# Patient Record
Sex: Female | Born: 1980 | Race: White | Hispanic: No | Marital: Single | Smoking: Never smoker
Health system: Southern US, Community
[De-identification: ages and names within clinical notes are randomized; demographics above are authoritative.]

## PROBLEM LIST (undated history)

## (undated) ENCOUNTER — Encounter

## (undated) ENCOUNTER — Encounter: Attending: Family Medicine | Primary: Family Medicine

## (undated) ENCOUNTER — Telehealth

## (undated) ENCOUNTER — Encounter: Attending: Family | Primary: Family

## (undated) ENCOUNTER — Telehealth: Attending: Family | Primary: Family

## (undated) ENCOUNTER — Ambulatory Visit: Payer: PRIVATE HEALTH INSURANCE

## (undated) ENCOUNTER — Ambulatory Visit: Payer: PRIVATE HEALTH INSURANCE | Attending: Registered" | Primary: Registered"

## (undated) ENCOUNTER — Encounter: Attending: Registered" | Primary: Registered"

## (undated) ENCOUNTER — Ambulatory Visit: Payer: PRIVATE HEALTH INSURANCE | Attending: Family Medicine | Primary: Family Medicine

## (undated) ENCOUNTER — Ambulatory Visit: Payer: PRIVATE HEALTH INSURANCE | Attending: "Endocrinology | Primary: "Endocrinology

## (undated) ENCOUNTER — Ambulatory Visit

## (undated) ENCOUNTER — Ambulatory Visit: Attending: Registered" | Primary: Registered"

## (undated) DIAGNOSIS — J45909 Unspecified asthma, uncomplicated: Secondary | ICD-10-CM

## (undated) DIAGNOSIS — K760 Fatty (change of) liver, not elsewhere classified: Secondary | ICD-10-CM

## (undated) DIAGNOSIS — E119 Type 2 diabetes mellitus without complications: Secondary | ICD-10-CM

## (undated) DIAGNOSIS — F419 Anxiety disorder, unspecified: Secondary | ICD-10-CM

---

## 2004-04-26 ENCOUNTER — Emergency Department: Payer: Self-pay | Admitting: Emergency Medicine

## 2005-09-01 IMAGING — CR DG CHEST 2V
1 series · 2 of 2 positions shown · non-contrast
Comparison: none

REASON FOR EXAM: Difficulty breathing. [HOSPITAL]
COMMENTS:

PROCEDURE:     DXR - DXR CHEST PA (OR AP) AND LATERAL  - April 27, 2004 [DATE]
RESULT:     PA and lateral views of the chest show the lungs to be clear and
well expanded without evidence of infiltrates, effusions or mass.

[Series 1: view not recorded · 0.17mm/px · 2 of 2 slices shown]
[im 1/2]
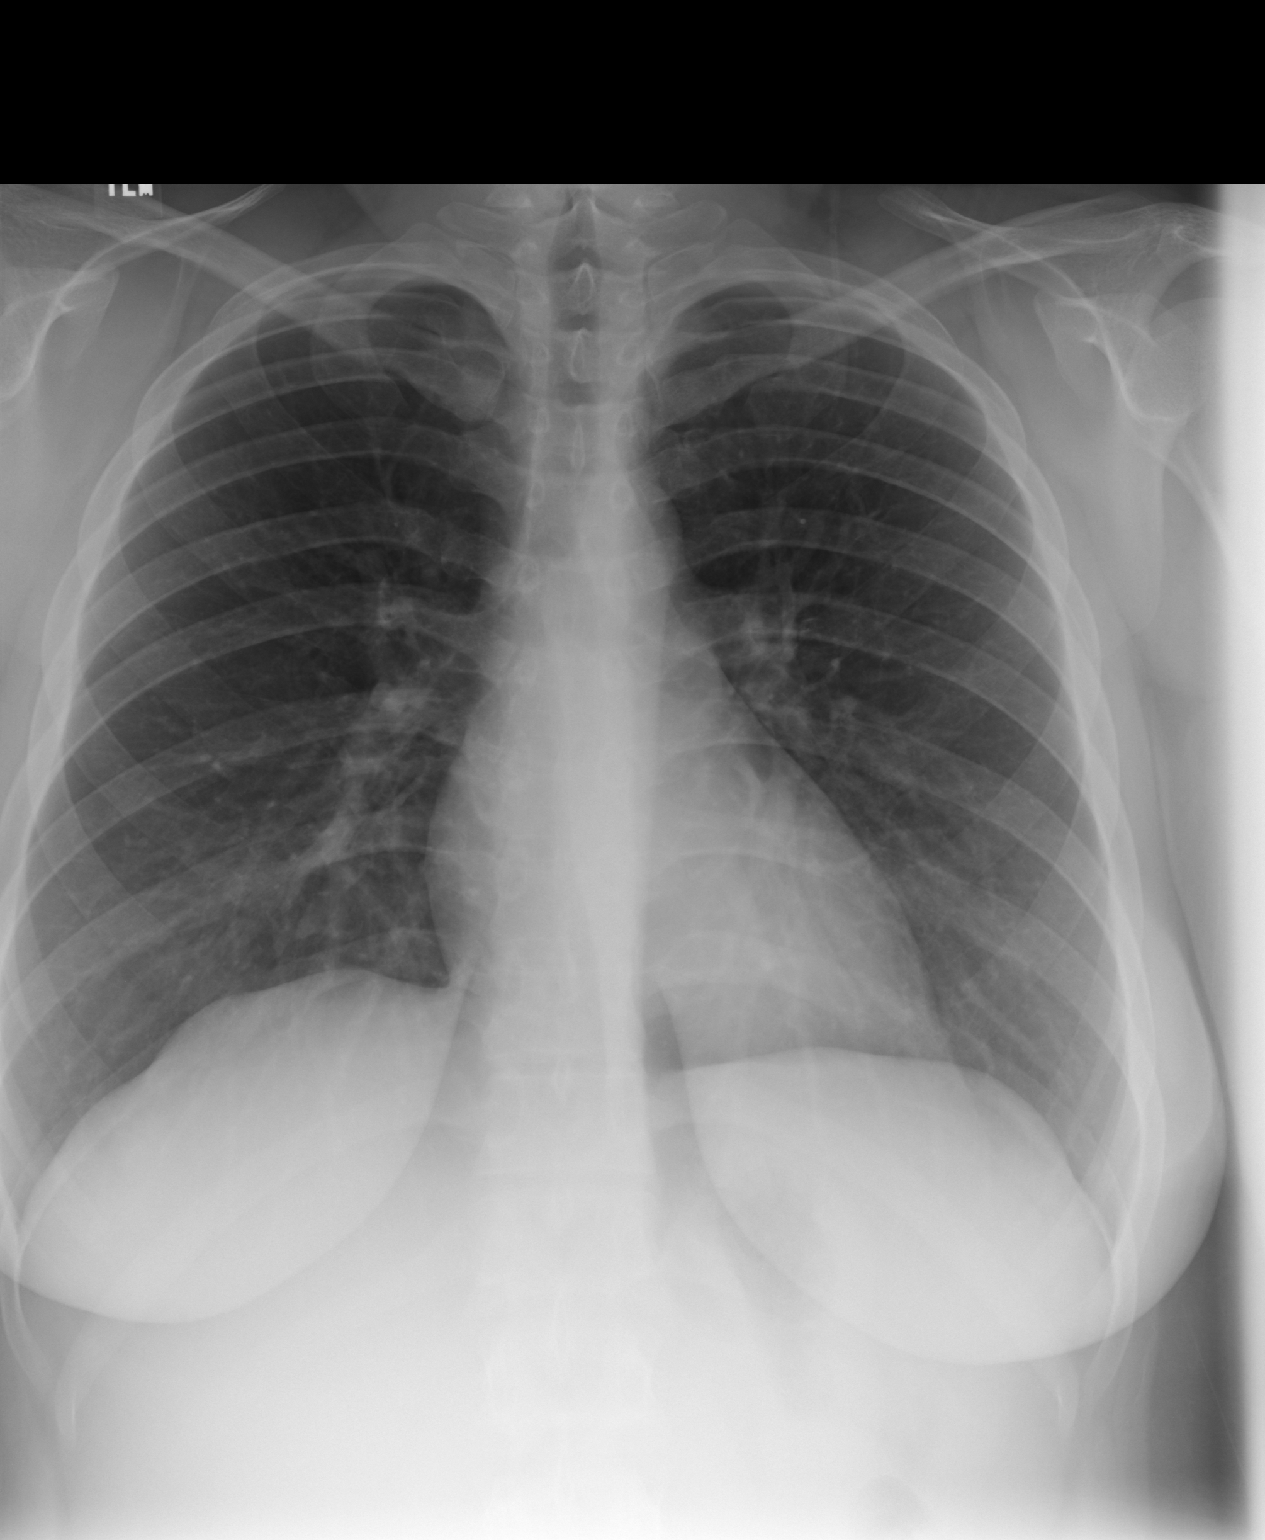
[im 2/2]
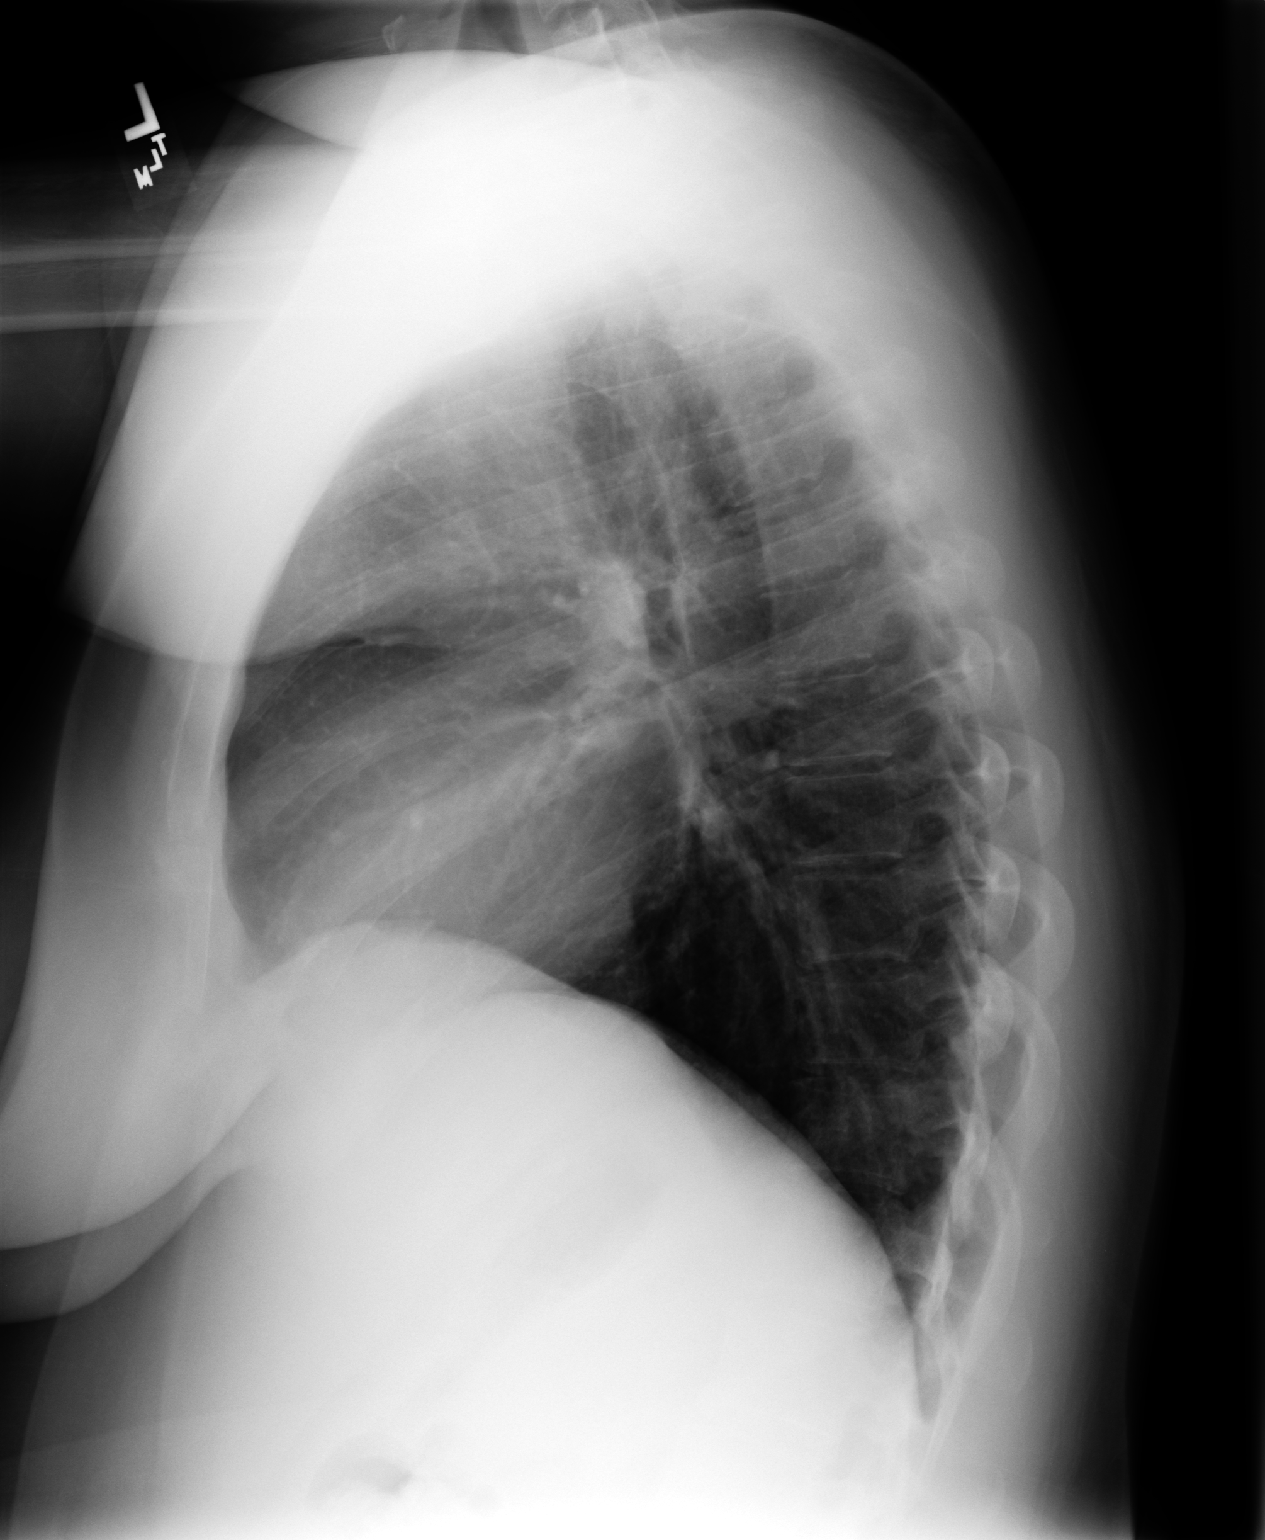

[2 of 2 positions shown; findings below may reference images not displayed]

IMPRESSION: No acute pulmonary disease.

## 2010-02-12 ENCOUNTER — Ambulatory Visit: Payer: Self-pay | Admitting: Internal Medicine

## 2014-07-18 ENCOUNTER — Ambulatory Visit
Admission: EM | Admit: 2014-07-18 | Discharge: 2014-07-18 | Disposition: A | Discharge status: Home / Self Care | Payer: No Typology Code available for payment source | Attending: Internal Medicine | Admitting: Internal Medicine

## 2014-07-18 DIAGNOSIS — S90511A Abrasion, right ankle, initial encounter: Secondary | ICD-10-CM

## 2014-07-18 DIAGNOSIS — L089 Local infection of the skin and subcutaneous tissue, unspecified: Secondary | ICD-10-CM

## 2014-07-18 HISTORY — DX: Unspecified asthma, uncomplicated: J45.909

## 2014-07-18 HISTORY — DX: Anxiety disorder, unspecified: F41.9

## 2014-07-18 MED ORDER — SULFAMETHOXAZOLE-TRIMETHOPRIM 800-160 MG PO TABS: 1.0000 | ORAL_TABLET | Freq: Two times a day (BID) | ORAL | Status: AC

## 2014-07-18 MED ORDER — CEPHALEXIN 500 MG PO CAPS: 500.0000 mg | ORAL_CAPSULE | Freq: Two times a day (BID) | ORAL | Status: DC

## 2014-07-18 NOTE — ED Provider Notes (Signed)
CSN: [id number]     Arrival date & time 07/18/14  1431 History   First MD Initiated Contact with Patient 07/18/14 1517     Chief Complaint  Patient presents with  . Wound Infection   HPI   Patient is a 34 year old lady who just returned from a fun tropical vacation. About a week ago, while on vacation, scraped the lateral aspect of her right ankle. This was not particularly painful or red or swollen, until yesterday, when the patient noticed that it was a little sore. Today, there is a zone of bright erythema around the scrape, and the ankle and foot are swollen. No fever, no malaise.  Past Medical History  Diagnosis Date  . Asthma   . Anxiety    No past surgical history on file. No family history on file. History  Substance Use Topics  . Smoking status: Never Smoker   . Smokeless tobacco: Not on file  . Alcohol Use: Yes   OB History    No data available     Review of Systems  All other systems reviewed and are negative.   Allergies  Klonopin; Lexapro; and Tramadol  Home Medications   Prior to Admission medications   Medication Sig Start Date End Date Taking? Authorizing Provider  albuterol (PROVENTIL HFA) 108 (90 BASE) MCG/ACT inhaler Inhale into the lungs every 6 (six) hours as needed for wheezing or shortness of breath.   Yes Historical Provider, MD  Norgestrel-Ethinyl Estradiol (LOW-OGESTREL PO) Take by mouth.   Yes Historical Provider, MD                 BP 120/67 mmHg  Pulse 87  Temp(Src) 99.1 F (37.3 C) (Tympanic)  Resp 16  Ht 5\' 7"  (1.702 m)  Wt 220 lb (99.791 kg)  BMI 34.45 kg/m2  SpO2 98% Physical Exam  Constitutional: She is oriented to person, place, and time. No distress.  Alert, nicely groomed  HENT:  Head: Atraumatic.  Eyes:  Conjugate gaze, no eye redness/drainage  Neck: Neck supple.  Cardiovascular: Normal rate.   Pulmonary/Chest: No respiratory distress.  Abdominal: She exhibits no distension.  Musculoskeletal: Normal range of motion.   No leg swelling  Neurological: She is alert and oriented to person, place, and time.  Skin: Skin is warm and dry.  No cyanosis Anterior aspect of the right lateral malleolus, there is an abrasion that is 1.25 inches across, moist scab, with a zone of bright erythema around it that is almost 2 inches across, with diffuse puffiness of the lateral ankle area. Preserved range of motion at the ankle. No fluctuance, not able to express anything from the wound. No streaking.  Nursing note and vitals reviewed.   ED Course  Procedures  No procedure at the urgent care today  MDM   1. Infected abrasion of ankle, right, initial encounter    Will cover for MRSA and strep with Bactrim and Keflex. Patient should wash the wound once or twice daily with soap and water, and apply antibiotic ointment and Band-Aid. Redness, swelling, pain, should not be a lot worse tomorrow, and improvement should be starting by about 48 hours. Recheck for worsening redness/swelling/pain, or new fever greater than 100.5.   Eustace Moore, MD 07/18/14 2104

## 2014-07-18 NOTE — Discharge Instructions (Signed)
Infected abrasion R ankle. Wash 1-2 times daily with soap/water, apply antibiotic ointment and bandaid. Prescriptions for bactrim and keflex (antibiotics) were sent to the [facility] in [place]. Should not be a lot worse (redness/swelling/pain) tomorrow; should start to see improvement in these things by Wednesday 6/29. Anticipate slow improvement over the next two weeks.    Abrasions An abrasion is a cut or scrape of the skin. Abrasions do not go through all layers of the skin. HOME CARE  If a bandage (dressing) was put on your wound, change it as told by your doctor. If the bandage sticks, soak it off with warm.  Wash the area with water and soap 2 times a day. Rinse off the soap. Pat the area dry with a clean towel.  Put on medicated cream (ointment) as told by your doctor.  Change your bandage right away if it gets wet or dirty.  Only take medicine as told by your doctor.  See your doctor within 24-48 hours to get your wound checked.  Check your wound for redness, puffiness (swelling), or yellowish-white fluid (pus). GET HELP RIGHT AWAY IF:   You have more pain in the wound.  You have redness, swelling, or tenderness around the wound.  You have pus coming from the wound.  You have a fever or lasting symptoms for more than 2-3 days.  You have a fever and your symptoms suddenly get worse.  You have a bad smell coming from the wound or bandage. MAKE SURE YOU:   Understand these instructions.  Will watch your condition.  Will get help right away if you are not doing well or get worse. Document Released: 06/26/2007 Document Revised: 10/02/2011 Document Reviewed: 12/11/2010 [facility] Patient Information 2015 [employer], [place]. This information is not intended to replace advice given to you by your health care provider. Make sure you discuss any questions you have with your health care provider.

## 2014-07-18 NOTE — ED Notes (Signed)
Complaints of possible wound infection to right ankle. On vacation in [place] and accidentally slipped and scraped her right ankle to a rock, was fine initially for a week and noticed to be infection just yesterday and today. Reports getting worst.

## 2015-11-15 ENCOUNTER — Encounter: Payer: Self-pay | Admitting: *Deleted

## 2015-11-15 ENCOUNTER — Ambulatory Visit
Admission: EM | Admit: 2015-11-15 | Discharge: 2015-11-15 | Disposition: A | Discharge status: Home / Self Care | Payer: BC Managed Care – PPO | Attending: Family Medicine | Admitting: Family Medicine

## 2015-11-15 DIAGNOSIS — M545 Low back pain, unspecified: Secondary | ICD-10-CM

## 2015-11-15 HISTORY — DX: Type 2 diabetes mellitus without complications: E11.9

## 2015-11-15 HISTORY — DX: Fatty (change of) liver, not elsewhere classified: K76.0

## 2015-11-15 MED ORDER — MELOXICAM 15 MG PO TABS: 15.0000 mg | ORAL_TABLET | Freq: Every day | ORAL | 0 refills | Status: DC | PRN

## 2015-11-15 MED ORDER — KETOROLAC TROMETHAMINE 60 MG/2ML IM SOLN
60.0000 mg | Freq: Once | INTRAMUSCULAR | Status: AC
  Administered 2015-11-15: 60 mg via INTRAMUSCULAR

## 2015-11-15 MED ORDER — CYCLOBENZAPRINE HCL 10 MG PO TABS: 10.0000 mg | ORAL_TABLET | Freq: Two times a day (BID) | ORAL | 0 refills | Status: DC | PRN

## 2015-11-15 NOTE — ED Provider Notes (Signed)
[facility] ____________________________________________  Time seen: Approximately 6:05 PM  I have reviewed the triage vital signs and the nursing notes.   HISTORY  Chief Complaint  Back Pain  HPI Hailey Evans is a 35 y.o. female present for the complaints of low back pain. Patient reports that she woke up with pain this morning. Patient reports that she felt fine yesterday. Patient states that she did roll over early this morning to take her dog outside and in the she had pain at that time. Patient reports she does have a history of some low back pain described as an aching pain. Patient describes today's pain is more of a spasm. Patient reports she does have some pain at rest but describes this is minimal. Patient states that pain is primarily with active movement. Patient states pain is a catching and spasming pain.   Denies any pain radiation, numbness or tingling sensation, urinary or bowel retention or incontinence. Denies any lower extremity or upper extremity involvement. Patient denies any fall, direct injury or trauma. Denies dysuria, vaginal discharge, vaginal odor, pelvic pain or abdominal pain. Patient reports she has continued to remain active today. States over-the-counter Advil did help some but pain has continued.   Patient's last menstrual period was 10/18/2015. Declines chance of pregnancy.     Past Medical History:  Diagnosis Date  . Anxiety   . Asthma   . Diabetes mellitus without complication (HCC)   . Fatty liver     There are no active problems to display for this patient.   History reviewed. No pertinent surgical history.  Current Outpatient Rx  . Order #: [id number] Class: Historical Med  . Order #: [id number] Class: Historical Med  . Order #: [id number] Class: Historical Med  . Order #: [id number] Class: Normal  . Order #: [id number] Class: Print  . Order #: [id number] Class: Normal    No current facility-administered medications for this  encounter.   Current Outpatient Prescriptions:  .  metFORMIN (GLUCOPHAGE) 500 MG tablet, Take 500 mg by mouth 2 (two) times daily with a meal., Disp: , Rfl:  .  Norgestrel-Ethinyl Estradiol (LOW-OGESTREL PO), Take by mouth., Disp: , Rfl:  .  albuterol (PROVENTIL HFA) 108 (90 BASE) MCG/ACT inhaler, Inhale into the lungs every 6 (six) hours as needed for wheezing or shortness of breath., Disp: , Rfl:  .  cephALEXin (KEFLEX) 500 MG capsule, Take 1 capsule (500 mg total) by mouth 2 (two) times daily., Disp: 20 capsule, Rfl: 0 .  cyclobenzaprine (FLEXERIL) 10 MG tablet, Take 1 tablet (10 mg total) by mouth 2 (two) times daily as needed for muscle spasms (whole tablet at night as needed; half tablet during day as needed. Do not drive or operate machinery while taking)., Disp: 15 tablet, Rfl: 0 .  meloxicam (MOBIC) 15 MG tablet, Take 1 tablet (15 mg total) by mouth daily as needed for pain., Disp: 10 tablet, Rfl: 0  Allergies Klonopin [clonazepam]; Lexapro [escitalopram]; and Tramadol  Family History  Problem Relation Age of Onset  . Stroke Father     Social History Social History  Substance Use Topics  . Smoking status: Never Smoker  . Smokeless tobacco: Never Used  . Alcohol use Yes    Review of Systems Constitutional: No fever/chills Eyes: No visual changes. ENT: No sore throat. Cardiovascular: Denies chest pain. Respiratory: Denies shortness of breath. Gastrointestinal: No abdominal pain.  No nausea, no vomiting.  No diarrhea.  No constipation. Genitourinary: Positive for dysuria. Musculoskeletal: Negative for back pain. Skin: Negative for  rash. Neurological: Negative for headaches, focal weakness or numbness.  10-point ROS otherwise negative.  ____________________________________________   PHYSICAL EXAM:  VITAL SIGNS: ED Triage Vitals  Enc Vitals Group     BP 11/15/15 1726 (!) 143/94     Pulse Rate 11/15/15 1726 83     Resp 11/15/15 1726 16     Temp 11/15/15 1726 98.5  F (36.9 C)     Temp Source 11/15/15 1726 Oral     SpO2 11/15/15 1726 100 %     Weight 11/15/15 1727 210 lb (95.3 kg)     Height 11/15/15 1727 5\' 7"  (1.702 m)     Head Circumference --      Peak Flow --      Pain Score 11/15/15 1736 7     Pain Loc --      Pain Edu? --      Excl. in GC? --     Constitutional: Alert and oriented. Well appearing and in no acute distress. Eyes: Conjunctivae are normal. PERRL. EOMI. ENT      Head: Normocephalic and atraumatic.      Nose: No congestion/rhinnorhea.      Mouth/Throat: Mucous membranes are moist.Oropharynx non-erythematous. Neck: No stridor. Supple without meningismus.  Hematological/Lymphatic/Immunilogical: No cervical lymphadenopathy. Cardiovascular: Normal rate, regular rhythm. Grossly normal heart sounds.  Good peripheral circulation. Respiratory: Normal respiratory effort without tachypnea nor retractions. Breath sounds are clear and equal bilaterally. No wheezes/rales/rhonchi.. Gastrointestinal: Soft and nontender. No distention. Normal Bowel sounds. No CVA tenderness. Musculoskeletal:  Nontender with normal range of motion in all extremities. No midline cervical or thoracic. Bilateral pedal pulses equal and easily palpated. Except: Patient with mild tenderness to palpation lower mid lumbar area and paralumbar bilaterally, diffuse mild tenderness in lumbar area, no ecchymosis, no erythema, skin intact, patient with pain on lumbar extension and right and left rotation with palpable right paralumbar muscle spasm, full range of motion present, no pain with bilateral standing knee lifts, no saddle anesthesia, steady gait, changes positions quickly. Bilateral plantar flexion and dorsiflexion strong and equal.       Right lower leg:  No tenderness or edema.      Left lower leg:  No tenderness or edema.  Neurologic:  Normal speech and language. No gross focal neurologic deficits are appreciated. Speech is normal. No gait instability.  Skin:  Skin  is warm, dry and intact. No rash noted. Psychiatric: Mood and affect are normal. Speech and behavior are normal. Patient exhibits appropriate insight and judgment   ___________________________________________   LABS (all labs ordered are listed, but only abnormal results are displayed)  Labs Reviewed - No data to display  RADIOLOGY  No results found. ____________________________________________   PROCEDURES Procedures    INITIAL IMPRESSION / ASSESSMENT AND PLAN / ED COURSE  Pertinent labs & imaging results that were available during my care of the patient were reviewed by me and considered in my medical decision making (see chart for details).  Appearing patient. No acute distress. Presenting for the complaints of low back pain present upon waking this morning. Denies trauma. Denies dysuria. History of some back pain in the past. Pain fully reproducible per patient by active range of motion. Diffuse lumbar tenderness. Suspect strain injury. As diffuse tenderness and no trauma, discussed with patient evaluation by x-ray and deferred x-ray at this time, patient agreed to this. Will treat patient 60 mg IM Toradol once in urgent care. Will treat patient home with daily oral Mobic as well as  when necessary Flexeril. Encouraged rest, stretching, supportive care.   Discussed follow up with Primary care physician this week. Discussed follow up and return parameters including no resolution or any worsening concerns. Patient verbalized understanding and agreed to plan.   ____________________________________________   FINAL CLINICAL IMPRESSION(S) / ED DIAGNOSES  Final diagnoses:  Acute bilateral low back pain without sciatica     Discharge Medication List as of 11/15/2015  6:22 PM    START taking these medications   Details  cyclobenzaprine (FLEXERIL) 10 MG tablet Take 1 tablet (10 mg total) by mouth 2 (two) times daily as needed for muscle spasms (whole tablet at night as needed;  half tablet during day as needed. Do not drive or operate machinery while taking)., Starting Wed 11/15/2015, Print    meloxicam (MOBIC) 15 MG tablet Take 1 tablet (15 mg total) by mouth daily as needed for pain., Starting Wed 11/15/2015, Normal        Note: This dictation was prepared with Dragon dictation along with smaller phrase technology. Any transcriptional errors that result from this process are unintentional.    Clinical Course      Renford DillsLindsey Chevy Sweigert, NP 11/15/15 2057

## 2015-11-15 NOTE — ED Triage Notes (Signed)
Patient started having lower back pain this AM. Patient reports muscle spasms but no shooting pain. Patient states that she bruised her tailbone in 2014.

## 2015-11-15 NOTE — Discharge Instructions (Signed)
Take medication as prescribed. Rest. Drink plenty of fluids. Avoid strenuous activity. ° °Follow up with your primary care physician this week as needed. Return to Urgent care for new or worsening concerns.  ° °

## 2015-12-22 ENCOUNTER — Encounter: Payer: Self-pay | Admitting: Emergency Medicine

## 2015-12-22 ENCOUNTER — Ambulatory Visit
Admission: EM | Admit: 2015-12-22 | Discharge: 2015-12-22 | Disposition: A | Discharge status: Home / Self Care | Payer: BC Managed Care – PPO | Attending: Family Medicine | Admitting: Family Medicine

## 2015-12-22 DIAGNOSIS — J069 Acute upper respiratory infection, unspecified: Secondary | ICD-10-CM | POA: Diagnosis not present

## 2015-12-22 DIAGNOSIS — H9209 Otalgia, unspecified ear: Secondary | ICD-10-CM

## 2015-12-22 DIAGNOSIS — J320 Chronic maxillary sinusitis: Secondary | ICD-10-CM

## 2015-12-22 MED ORDER — AMOXICILLIN-POT CLAVULANATE 875-125 MG PO TABS: 1.0000 | ORAL_TABLET | Freq: Two times a day (BID) | ORAL | 0 refills | Status: DC

## 2015-12-22 MED ORDER — BENZONATATE 100 MG PO CAPS: 100.0000 mg | ORAL_CAPSULE | Freq: Three times a day (TID) | ORAL | 0 refills | Status: DC | PRN

## 2015-12-22 MED ORDER — HYDROCOD POLST-CPM POLST ER 10-8 MG/5ML PO SUER: 5.0000 mL | Freq: Every evening | ORAL | 0 refills | Status: DC | PRN

## 2015-12-22 NOTE — Discharge Instructions (Signed)
Take medication as prescribed. Rest. Drink plenty of fluids.  ° °Follow up with your primary care physician this week as needed. Return to Urgent care for new or worsening concerns.  ° °

## 2015-12-22 NOTE — ED Triage Notes (Signed)
Patient c/o cough and chest congestion since Monday.  

## 2015-12-22 NOTE — ED Provider Notes (Signed)
[facility] ____________________________________________  Time seen: Approximately 3:51 PM  I have reviewed the triage vital signs and the nursing notes.   HISTORY  Chief Complaint Cough  HPI Hailey Evans is a 35 y.o. female present for the complaints of 5 days of runny nose, nasal congestion, cough. Patient was initially she has sore throat that has since resolved. Patient reports cough is worse at night with associated postnasal drainage. Denies fevers. Reports continues to eat and drink well. Denies known sick contacts. Also reports bilateral ear discomfort.  Denies current chest pain, shortness of breath, rash, sore throat, extremity pain or actually swelling. Denies recent sickness or recent antibiotic use. Denies dysuria.  [facility] [place]: PCP Patient's last menstrual period was 12/13/2015 (exact date). Denies pregnancy.   Past Medical History:  Diagnosis Date  . Anxiety   . Asthma   . Diabetes mellitus without complication (HCC)   . Fatty liver     There are no active problems to display for this patient.   History reviewed. No pertinent surgical history.  Current Outpatient Rx  . Order #: [id number] Class: Historical Med  . Order #: [id number] Class: Normal  . Order #: [id number] Class: Normal  . Order #: [id number] Class: Print  . Order #: [id number] Class: Historical Med  . Order #: [id number] Class: Historical Med    No current facility-administered medications for this encounter.   Current Outpatient Prescriptions:  .  albuterol (PROVENTIL HFA) 108 (90 BASE) MCG/ACT inhaler, Inhale into the lungs every 6 (six) hours as needed for wheezing or shortness of breath., Disp: , Rfl:  .  amoxicillin-clavulanate (AUGMENTIN) 875-125 MG tablet, Take 1 tablet by mouth every 12 (twelve) hours., Disp: 20 tablet, Rfl: 0 .  benzonatate (TESSALON PERLES) 100 MG capsule, Take 1 capsule (100 mg total) by mouth 3 (three) times daily as needed., Disp: 15 capsule,  Rfl: 0 .  chlorpheniramine-HYDROcodone (TUSSIONEX PENNKINETIC ER) 10-8 MG/5ML SUER, Take 5 mLs by mouth at bedtime as needed. do not drive or operate machinery while taking as can cause drowsiness., Disp: 75 mL, Rfl: 0 .  metFORMIN (GLUCOPHAGE) 500 MG tablet, Take 500 mg by mouth 2 (two) times daily with a meal., Disp: , Rfl:  .  Norgestrel-Ethinyl Estradiol (LOW-OGESTREL PO), Take by mouth., Disp: , Rfl:   Allergies Klonopin [clonazepam]; Lexapro [escitalopram]; and Tramadol  Family History  Problem Relation Age of Onset  . Stroke Father     Social History Social History  Substance Use Topics  . Smoking status: Never Smoker  . Smokeless tobacco: Never Used  . Alcohol use Yes    Review of Systems Constitutional: No fever/chills Eyes: No visual changes. ENT: As above. Cardiovascular: Denies chest pain. Respiratory: Denies shortness of breath. Gastrointestinal: No abdominal pain.  No nausea, no vomiting.  No diarrhea.  No constipation. Genitourinary: Negative for dysuria. Musculoskeletal: Negative for back pain. Skin: Negative for rash. Neurological: Negative for headaches, focal weakness or numbness.  10-point ROS otherwise negative.  ____________________________________________   PHYSICAL EXAM:  VITAL SIGNS: ED Triage Vitals  Enc Vitals Group     BP 12/22/15 1409 125/80     Pulse Rate 12/22/15 1409 89     Resp 12/22/15 1409 16     Temp 12/22/15 1409 99 F (37.2 C)     Temp Source 12/22/15 1409 Oral     SpO2 12/22/15 1409 99 %     Weight 12/22/15 1408 215 lb (97.5 kg)     Height 12/22/15 1408 5\' 7"  (1.702 m)  Head Circumference --      Peak Flow --      Pain Score 12/22/15 1410 3     Pain Loc --      Pain Edu? --      Excl. in GC? --    Constitutional: Alert and oriented. Well appearing and in no acute distress. Eyes: Conjunctivae are normal. PERRL. EOMI. Head: Atraumatic.Mild tenderness to palpation bilateral frontal and maxillary sinuses. No swelling.  No erythema.   Ears: Right : Moderate erythema, otherwise normal TM, nontender. Left : Mild erythema and dullness, nontender. No surrounding erythema, swelling or tenderness bilaterally.   Nose: nasal congestion with bilateral nasal turbinate erythema and edema.   Mouth/Throat: Mucous membranes are moist.  Oropharynx non-erythematous.No tonsillar swelling or exudate.  Neck: No stridor.  No cervical spine tenderness to palpation. Hematological/Lymphatic/Immunilogical: No cervical lymphadenopathy. Cardiovascular: Normal rate, regular rhythm. Grossly normal heart sounds.  Good peripheral circulation. Respiratory: Normal respiratory effort.  No retractions. Lungs CTAB. No wheezes, rales or rhonchi. Good air movement.  dry intermittent cough medicine. Slight bronchospasm noted with cough. No focal area of consolidation auscultated..  Gastrointestinal: Soft and nontender. No CVA tenderness. Musculoskeletal: No lower or upper extremity tenderness nor edema.  No cervical, thoracic or lumbar tenderness to palpation.  Neurologic:  Normal speech and language. No gross focal neurologic deficits are appreciated. No gait instability. Skin:  Skin is warm, dry and intact. No rash noted. Psychiatric: Mood and affect are normal. Speech and behavior are normal.  ___________________________________________   LABS (all labs ordered are listed, but only abnormal results are displayed)  Labs Reviewed - No data to display ____________________________________________  PROCEDURES Procedures    INITIAL IMPRESSION / ASSESSMENT AND PLAN / ED COURSE  Pertinent labs & imaging results that were available during my care of the patient were reviewed by me and considered in my medical decision making (see chart for details).   well-appearing patient. Suspect upper respiratory viral infection, however discussed in detail with patient supportive treatment. Discussed in detail with patient if symptoms continue or right ear  complaints worsen as she has borderline ear infection at this time to start the oral Augmentin prescription will treat supportively with when necessary Tussionex at night and when necessary Tessalon Perles. [place] controlled substance database reviewed showing no controlled substances in last 6 months. Encouraged rest, fluids and PCP follow-up as needed. Discussed indication, risks and benefits of medications with patient.  Discussed follow up with Primary care physician this week. Discussed follow up and return parameters including no resolution or any worsening concerns. Patient verbalized understanding and agreed to plan.   ____________________________________________   FINAL CLINICAL IMPRESSION(S) / ED DIAGNOSES  Final diagnoses:  Upper respiratory tract infection, unspecified type  Maxillary sinusitis, unspecified chronicity  Otalgia, unspecified laterality     Discharge Medication List as of 12/22/2015  2:58 PM    START taking these medications   Details  amoxicillin-clavulanate (AUGMENTIN) 875-125 MG tablet Take 1 tablet by mouth every 12 (twelve) hours., Starting Fri 12/22/2015, Normal    benzonatate (TESSALON PERLES) 100 MG capsule Take 1 capsule (100 mg total) by mouth 3 (three) times daily as needed., Starting Fri 12/22/2015, Normal    chlorpheniramine-HYDROcodone (TUSSIONEX PENNKINETIC ER) 10-8 MG/5ML SUER Take 5 mLs by mouth at bedtime as needed. do not drive or operate machinery while taking as can cause drowsiness., Starting Fri 12/22/2015, Print        Note: This dictation was prepared with Dragon dictation along with smaller  Lobbyistphrase technology. Any transcriptional errors that result from this process are unintentional.    Clinical Course       Renford DillsLindsey Shauntee Karp, NP 12/22/15 1557

## 2016-09-27 ENCOUNTER — Ambulatory Visit
Admission: EM | Admit: 2016-09-27 | Discharge: 2016-09-27 | Disposition: A | Discharge status: Home / Self Care | Payer: BC Managed Care – PPO | Attending: Family Medicine | Admitting: Family Medicine

## 2016-09-27 ENCOUNTER — Encounter: Payer: Self-pay | Admitting: Emergency Medicine

## 2016-09-27 DIAGNOSIS — M62830 Muscle spasm of back: Secondary | ICD-10-CM

## 2016-09-27 DIAGNOSIS — S39012A Strain of muscle, fascia and tendon of lower back, initial encounter: Secondary | ICD-10-CM | POA: Diagnosis not present

## 2016-09-27 MED ORDER — KETOROLAC TROMETHAMINE 60 MG/2ML IM SOLN
60.0000 mg | Freq: Once | INTRAMUSCULAR | Status: AC
  Administered 2016-09-27: 60 mg via INTRAMUSCULAR

## 2016-09-27 MED ORDER — MELOXICAM 15 MG PO TABS: 15.0000 mg | ORAL_TABLET | Freq: Every day | ORAL | 0 refills | Status: DC

## 2016-09-27 MED ORDER — ORPHENADRINE CITRATE ER 100 MG PO TB12: 100.0000 mg | ORAL_TABLET | Freq: Two times a day (BID) | ORAL | 0 refills | Status: DC

## 2016-09-27 NOTE — ED Triage Notes (Signed)
Low back pain and muscle spasm. Pain more on left side then right. Had same spasm last year

## 2016-09-27 NOTE — ED Provider Notes (Signed)
[facility]    CSN: [id number] Arrival date & time: 09/27/16  1822     History   Chief Complaint Chief Complaint  Patient presents with  . Back Pain    HPI Terrika Zuver is a 36 y.o. female.   Patient is a 36 year old white female with a history of back pain. She has similar back pain about a year and a half ago to 2 years ago. She reports yesterday picking up a toy of her dog and dementia bent over she felt that it was not going to be a good day. She reports having spasm pain and discomfort the pain has continued all through the night so she finally came in to be seen and evaluated. She denies any trauma to her back and was just lifting.    Back Pain    Past Medical History:  Diagnosis Date  . Anxiety   . Asthma   . Diabetes mellitus without complication (HCC)   . Fatty liver     There are no active problems to display for this patient.   History reviewed. No pertinent surgical history.  OB History    No data available       Home Medications    Prior to Admission medications   Medication Sig Start Date End Date Taking? Authorizing Provider  albuterol (PROVENTIL HFA) 108 (90 BASE) MCG/ACT inhaler Inhale into the lungs every 6 (six) hours as needed for wheezing or shortness of breath.   Yes [provider]  metFORMIN (GLUCOPHAGE) 500 MG tablet Take 500 mg by mouth 2 (two) times daily with a meal.   Yes [provider]  Norgestrel-Ethinyl Estradiol (LOW-OGESTREL PO) Take by mouth.   Yes [provider]  amoxicillin-clavulanate (AUGMENTIN) 875-125 MG tablet Take 1 tablet by mouth every 12 (twelve) hours. 12/22/15   Renford Dills, NP  benzonatate (TESSALON PERLES) 100 MG capsule Take 1 capsule (100 mg total) by mouth 3 (three) times daily as needed. 12/22/15   Renford Dills, NP  chlorpheniramine-HYDROcodone [facility] PENNKINETIC ER) 10-8 MG/5ML SUER Take 5 mLs by mouth at bedtime as needed. do not drive or operate machinery  while taking as can cause drowsiness. 12/22/15   Renford Dills, NP  meloxicam (MOBIC) 15 MG tablet Take 1 tablet (15 mg total) by mouth daily. 09/27/16   Hassan Rowan, MD  orphenadrine (NORFLEX) 100 MG tablet Take 1 tablet (100 mg total) by mouth 2 (two) times daily. 09/27/16   Hassan Rowan, MD    Family History Family History  Problem Relation Age of Onset  . Stroke Father     Social History Social History  Substance Use Topics  . Smoking status: Never Smoker  . Smokeless tobacco: Never Used  . Alcohol use Yes     Allergies   Klonopin [clonazepam]; Lexapro [escitalopram]; and Tramadol   Review of Systems Review of Systems  Musculoskeletal: Positive for back pain, myalgias and neck pain.  All other systems reviewed and are negative.    Physical Exam Triage Vital Signs ED Triage Vitals  Enc Vitals Group     BP 09/27/16 1834 (!) 144/86     Pulse Rate 09/27/16 1834 93     Resp 09/27/16 1834 18     Temp 09/27/16 1834 98.1 F (36.7 C)     Temp Source 09/27/16 1834 Oral     SpO2 09/27/16 1834 99 %     Weight 09/27/16 1836 240 lb (108.9 kg)     Height 09/27/16 1836 5'  7" (1.702 m)     Head Circumference --      Peak Flow --      Pain Score 09/27/16 1836 6     Pain Loc --      Pain Edu? --      Excl. in GC? --    No data found.   Updated Vital Signs BP (!) 144/86 (BP Location: Left Arm)   Pulse 93   Temp 98.1 F (36.7 C) (Oral)   Resp 18   Ht 5\' 7"  (1.702 m)   Wt 240 lb (108.9 kg)   LMP 09/22/2016   SpO2 99%   BMI 37.59 kg/m   Visual Acuity Right Eye Distance:   Left Eye Distance:   Bilateral Distance:    Right Eye Near:   Left Eye Near:    Bilateral Near:     Physical Exam  Constitutional: She is oriented to person, place, and time. She appears well-developed and well-nourished.  HENT:  Head: Normocephalic and atraumatic.  Right Ear: External ear normal.  Left Ear: External ear normal.  Mouth/Throat: Oropharynx is clear and moist.  Eyes: Pupils  are equal, round, and reactive to light.  Neck: Normal range of motion. Neck supple.  Pulmonary/Chest: Effort normal and breath sounds normal.  Abdominal: Soft. Bowel sounds are normal.  Musculoskeletal: She exhibits tenderness.       Right shoulder: She exhibits pain and spasm.       Arms: The pain is away from the lumbar spine over to the left there is muscle spasm present as well. Reflexes. Gait is careful and deliberate  Lymphadenopathy:    She has no cervical adenopathy.  Neurological: She is alert and oriented to person, place, and time. No cranial nerve deficit.  Skin: Skin is warm and dry.  Psychiatric: She has a normal mood and affect.  Vitals reviewed.    UC Treatments / Results  Labs (all labs ordered are listed, but only abnormal results are displayed) Labs Reviewed - No data to display  EKG  EKG Interpretation None       Radiology No results found.  Procedures Procedures (including critical care time)  Medications Ordered in UC Medications  ketorolac (TORADOL) injection 60 mg (60 mg Intramuscular Given 09/27/16 1917)     Initial Impression / Assessment and Plan / UC Course  I have reviewed the triage vital signs and the nursing notes.  Pertinent labs & imaging results that were available during my care of the patient were reviewed by me and considered in my medical decision making (see chart for details).     Toradol seemed to help last time will give 60 Toradol IM place on Norflex muscle relaxants 1 tablet twice a day Mobic 15 mg 1 tablet daily and she declined work note follow-up with PCP in 3 weeks or with Dr.Vin Duwayne HeckIsaiah next week possible manipulation of spine and realignment (to reduce muscle spasms and muscle strain.  Final Clinical Impressions(s) / UC Diagnoses   Final diagnoses:  Muscle spasm of back  Strain of lumbar region, initial encounter    New Prescriptions New Prescriptions   MELOXICAM (MOBIC) 15 MG TABLET    Take 1 tablet (15 mg  total) by mouth daily.   ORPHENADRINE (NORFLEX) 100 MG TABLET    Take 1 tablet (100 mg total) by mouth 2 (two) times daily.   Note: This dictation was prepared with Dragon dictation along with smaller phrase technology. Any transcriptional errors that result from this process  are unintentional.  Controlled Substance Prescriptions [place] Controlled Substance Registry consulted? Not Applicable   Hassan Rowan, MD 09/27/16 [phone]

## 2016-12-19 ENCOUNTER — Encounter: Payer: Self-pay | Admitting: Emergency Medicine

## 2016-12-19 ENCOUNTER — Ambulatory Visit
Admission: EM | Admit: 2016-12-19 | Discharge: 2016-12-19 | Disposition: A | Discharge status: Home / Self Care | Payer: BC Managed Care – PPO | Attending: Family Medicine | Admitting: Family Medicine

## 2016-12-19 ENCOUNTER — Other Ambulatory Visit: Payer: Self-pay

## 2016-12-19 DIAGNOSIS — R69 Illness, unspecified: Secondary | ICD-10-CM

## 2016-12-19 DIAGNOSIS — J029 Acute pharyngitis, unspecified: Secondary | ICD-10-CM | POA: Diagnosis not present

## 2016-12-19 DIAGNOSIS — J111 Influenza due to unidentified influenza virus with other respiratory manifestations: Secondary | ICD-10-CM

## 2016-12-19 LAB — RAPID STREP SCREEN (MED CTR MEBANE ONLY): Streptococcus, Group A Screen (Direct): NEGATIVE

## 2016-12-19 MED ORDER — OSELTAMIVIR PHOSPHATE 75 MG PO CAPS: 75.0000 mg | ORAL_CAPSULE | Freq: Two times a day (BID) | ORAL | 0 refills | Status: DC

## 2016-12-19 MED ORDER — BENZONATATE 100 MG PO CAPS: 100.0000 mg | ORAL_CAPSULE | Freq: Three times a day (TID) | ORAL | 0 refills | Status: DC | PRN

## 2016-12-19 NOTE — ED Provider Notes (Addendum)
[facility] ____________________________________________  Time seen: Approximately 9:14 AM  I have reviewed the triage vital signs and the nursing notes.   HISTORY  Chief Complaint Cough; Sore Throat; and Headache  HPI Hailey Evans is a 36 y.o. female presenting for evaluation of 2 days of quick onset sore throat, nasal congestion, cough, chills, body aches and not feeling well.  States  Tuesday night she had a fever of 101 degrees orally.  States has been intermittently taken Tylenol and ibuprofen as well as over-the-counter cough and congestion medication.  Denies any other known fevers, but reports has been intermittently taking medicines.  States has been having some intermittent headaches.  States sore throat is mild to moderate, does hurt with swallowing.  States cough is primarily dry nonproductive cough.  Reports continues to drink and eat overall well.  States that she does work around a lot of people as well as was recently had a large concert.  Denies home sick contacts.  Denies other aggravating or alleviating factors. Denies chest pain, shortness of breath, abdominal pain, dysuria, extremity pain, extremity swelling or rash. Denies recent sickness. Denies recent antibiotic use.   [place]: PCP Patient's last menstrual period was 12/12/2016 (approximate).denies pregnancy.   Past Medical History:  Diagnosis Date  . Anxiety   . Asthma   . Diabetes mellitus without complication (HCC)   . Fatty liver     There are no active problems to display for this patient.   History reviewed. No pertinent surgical history.   No current facility-administered medications for this encounter.   Current Outpatient Medications:  .  albuterol (PROVENTIL HFA) 108 (90 BASE) MCG/ACT inhaler, Inhale into the lungs every 6 (six) hours as needed for wheezing or shortness of breath., Disp: , Rfl:  .  meloxicam (MOBIC) 15 MG tablet, Take 1 tablet (15 mg total) by  mouth daily., Disp: 30 tablet, Rfl: 0 .  metFORMIN (GLUCOPHAGE) 500 MG tablet, Take 500 mg by mouth 2 (two) times daily with a meal., Disp: , Rfl:  .  Norgestrel-Ethinyl Estradiol (LOW-OGESTREL PO), Take by mouth., Disp: , Rfl:  .  orphenadrine (NORFLEX) 100 MG tablet, Take 1 tablet (100 mg total) by mouth 2 (two) times daily., Disp: 60 tablet, Rfl: 0 .  benzonatate (TESSALON PERLES) 100 MG capsule, Take 1 capsule (100 mg total) by mouth 3 (three) times daily as needed for cough., Disp: 15 capsule, Rfl: 0 .  oseltamivir (TAMIFLU) 75 MG capsule, Take 1 capsule (75 mg total) by mouth every 12 (twelve) hours., Disp: 10 capsule, Rfl: 0  Allergies Klonopin [clonazepam]; Lexapro [escitalopram]; and Tramadol  Family History  Problem Relation Age of Onset  . Stroke Father     Social History Social History   Tobacco Use  . Smoking status: Never Smoker  . Smokeless tobacco: Never Used  Substance Use Topics  . Alcohol use: Yes  . Drug use: No    Review of Systems Constitutional: As above. ENT: As above.  Cardiovascular: Denies chest pain. Respiratory: Denies shortness of breath. Gastrointestinal: No abdominal pain.  No nausea, no vomiting.  No diarrhea.   Genitourinary: Negative for dysuria. Musculoskeletal: Negative for back pain. Skin: Negative for rash.  ____________________________________________   PHYSICAL EXAM:  VITAL SIGNS: ED Triage Vitals  Enc Vitals Group     BP 12/19/16 0848 135/84     Pulse Rate 12/19/16 0848 100     Resp 12/19/16 0848 16     Temp 12/19/16 0848 98.6 F (  37 C)     Temp Source 12/19/16 0848 Oral     SpO2 12/19/16 0848 98 %     Weight 12/19/16 0844 240 lb (108.9 kg)     Height 12/19/16 0844 5\' 7"  (1.702 m)     Head Circumference --      Peak Flow --      Pain Score 12/19/16 0845 6     Pain Loc --      Pain Edu? --      Excl. in GC? --    Constitutional: Alert and oriented. Well appearing and in no acute distress. Eyes: Conjunctivae are  normal.  Head: Atraumatic. No sinus tenderness to palpation. No swelling. No erythema.  Ears: no erythema, normal TMs bilaterally.   Nose:Nasal congestion   Mouth/Throat: Mucous membranes are moist. Mild pharyngeal erythema. No tonsillar swelling or exudate.  Neck: No stridor.  No cervical spine tenderness to palpation. Hematological/Lymphatic/Immunilogical: No cervical lymphadenopathy. Cardiovascular: Normal rate, regular rhythm. Grossly normal heart sounds.  Good peripheral circulation. Respiratory: Normal respiratory effort.  No retractions. No wheezes, rales or rhonchi. Good air movement.  Gastrointestinal: Soft and nontender.  Musculoskeletal: Ambulatory with steady gait. No cervical, thoracic or lumbar tenderness to palpation. Neurologic:  Normal speech and language. No gait instability. Skin:  Skin appears warm, dry and intact. No rash noted. Psychiatric: Mood and affect are normal. Speech and behavior are normal.  ___________________________________________   LABS (all labs ordered are listed, but only abnormal results are displayed)  Labs Reviewed  RAPID STREP SCREEN (NOT AT [facility])  CULTURE, GROUP A STREP [facility])   ____________________________________________  PROCEDURES Procedures    INITIAL IMPRESSION / ASSESSMENT AND PLAN / ED COURSE  Pertinent labs & imaging results that were available during my care of the patient were reviewed by me and considered in my medical decision making (see chart for details).  Overall well-appearing patient.  No acute distress.  Quick strep negative, will culture.  Suspect influenza-like illness.  Discussed treatment options with patient.  Will treat patient with oral Tamiflu, as needed Tessalon Perles, encouraged rest, fluids and supportive care.  Work note given for today and tomorrow. Discussed indication, risks and benefits of medications with patient.  Discussed follow up with Primary care physician this week as needed. Discussed follow  up and return parameters including no resolution or any worsening concerns. Patient verbalized understanding and agreed to plan.   ____________________________________________   FINAL CLINICAL IMPRESSION(S) / ED DIAGNOSES  Final diagnoses:  Influenza-like illness  Pharyngitis, unspecified etiology     ED Discharge Orders        Ordered    oseltamivir (TAMIFLU) 75 MG capsule  Every 12 hours     12/19/16 0942    benzonatate (TESSALON PERLES) 100 MG capsule  3 times daily PRN     12/19/16 [id number]       Note: This dictation was prepared with Dragon dictation along with smaller phrase technology. Any transcriptional errors that result from this process are unintentional.         Renford DillsMiller, Kerrington Greenhalgh, NP 12/19/16 0949    Renford DillsMiller, Jamine Wingate, NP 12/19/16 [phone]

## 2016-12-19 NOTE — ED Triage Notes (Signed)
Patient c/o HAs, sore throat, cough, sinus drainage, congestion and bodyaches that started last Tuesday. Patient reports fever on Tuesday night.

## 2016-12-19 NOTE — Discharge Instructions (Signed)
Take medication as prescribed. Rest. Drink plenty of fluids.  ° °Follow up with your primary care physician this week as needed. Return to Urgent care for new or worsening concerns.  ° °

## 2016-12-21 ENCOUNTER — Other Ambulatory Visit: Payer: Self-pay

## 2016-12-21 ENCOUNTER — Encounter: Payer: Self-pay | Admitting: Gynecology

## 2016-12-21 ENCOUNTER — Ambulatory Visit
Admission: EM | Admit: 2016-12-21 | Discharge: 2016-12-21 | Disposition: A | Discharge status: Home / Self Care | Payer: BC Managed Care – PPO | Attending: Family Medicine | Admitting: Family Medicine

## 2016-12-21 DIAGNOSIS — R69 Illness, unspecified: Secondary | ICD-10-CM | POA: Diagnosis not present

## 2016-12-21 DIAGNOSIS — J111 Influenza due to unidentified influenza virus with other respiratory manifestations: Secondary | ICD-10-CM

## 2016-12-21 LAB — RAPID STREP SCREEN (MED CTR MEBANE ONLY): Streptococcus, Group A Screen (Direct): NEGATIVE

## 2016-12-21 NOTE — ED Provider Notes (Signed)
[facility]    CSN: [id number] Arrival date & time: 12/21/16  [id number]  History   Chief Complaint Chief Complaint  Patient presents with  . Cough   HPI  36 year old female presents with worsening respiratory symptoms.  Patient was recently seen on 11/29.  Rapid strep is negative.  She was diagnosed with an influenza-like illness and was placed on Tamiflu.  Patient states that she was feeling slightly better as of yesterday.  This morning she reports worsening sore throat and anterior neck pain.  She reports that she is continued to have cough and headache.  Patient feels like she has a bacterial infection.  No reports of fever.  Sore throat is severe.  She has had improvement with Tylenol.  No known exacerbating factors.  She reports difficulty swallowing.  No other complaints or concerns at this time.  Past Medical History:  Diagnosis Date  . Anxiety   . Asthma   . Diabetes mellitus without complication (HCC)   . Fatty liver    History reviewed. No pertinent surgical history.  OB History    No data available     Home Medications    Prior to Admission medications   Medication Sig Start Date End Date Taking? Authorizing Provider  albuterol (PROVENTIL HFA) 108 (90 BASE) MCG/ACT inhaler Inhale into the lungs every 6 (six) hours as needed for wheezing or shortness of breath.   Yes [provider]  benzonatate (TESSALON PERLES) 100 MG capsule Take 1 capsule (100 mg total) by mouth 3 (three) times daily as needed for cough. 12/19/16  Yes Renford DillsMiller, Lindsey, NP  meloxicam (MOBIC) 15 MG tablet Take 1 tablet (15 mg total) by mouth daily. 09/27/16  Yes Hassan RowanWade, Eugene, MD  metFORMIN (GLUCOPHAGE) 500 MG tablet Take 500 mg by mouth 2 (two) times daily with a meal.   Yes [provider]  Norgestrel-Ethinyl Estradiol (LOW-OGESTREL PO) Take by mouth.   Yes [provider]  orphenadrine (NORFLEX) 100 MG tablet Take 1 tablet (100 mg total) by mouth 2 (two) times  daily. 09/27/16  Yes Hassan RowanWade, Eugene, MD  oseltamivir (TAMIFLU) 75 MG capsule Take 1 capsule (75 mg total) by mouth every 12 (twelve) hours. 12/19/16  Yes Renford DillsMiller, Lindsey, NP    Family History Family History  Problem Relation Age of Onset  . Stroke Father     Social History Social History   Tobacco Use  . Smoking status: Never Smoker  . Smokeless tobacco: Never Used  Substance Use Topics  . Alcohol use: Yes  . Drug use: No     Allergies   Klonopin [clonazepam]; Lexapro [escitalopram]; and Tramadol   Review of Systems Review of Systems  HENT: Positive for sore throat.   Respiratory: Positive for cough.   Neurological: Positive for headaches.   Physical Exam Triage Vital Signs ED Triage Vitals [12/21/16 1017]  Enc Vitals Group     BP 140/90     Pulse Rate (!) 103     Resp 16     Temp 99.1 F (37.3 C)     Temp Source Oral     SpO2 99 %     Weight      Height      Head Circumference      Peak Flow      Pain Score      Pain Loc      Pain Edu?      Excl. in GC?    No data found.  Updated Vital  Signs BP 140/90 (BP Location: Left Arm)   Pulse (!) 103   Temp 99.1 F (37.3 C) (Oral)   Resp 16   LMP 12/12/2016 (Approximate)   SpO2 99%   Physical Exam  Constitutional: She is oriented to person, place, and time. She appears well-developed. No distress.  HENT:  Oropharynx with mild erythema.  Cobblestoning/evidence of postnasal drip noted.  Eyes: Conjunctivae are normal. Right eye exhibits no discharge. Left eye exhibits no discharge.  Neck:  Supple.  Anterior cervical tenderness.  No appreciable lymphadenopathy secondary to obesity.  Cardiovascular: Regular rhythm.  Tachycardia.  Pulmonary/Chest: Effort normal and breath sounds normal. She has no wheezes. She has no rales.  Neurological: She is alert and oriented to person, place, and time.  Skin: Skin is warm. No rash noted.  Psychiatric: She has a normal mood and affect. Her behavior is normal.  Vitals  reviewed.  UC Treatments / Results  Labs (all labs ordered are listed, but only abnormal results are displayed) Labs Reviewed  RAPID STREP SCREEN (NOT AT [facility])    EKG  EKG Interpretation None       Radiology No results found.  Procedures Procedures (including critical care time)  Medications Ordered in UC Medications - No data to display   Initial Impression / Assessment and Plan / UC Course  I have reviewed the triage vital signs and the nursing notes.  Pertinent labs & imaging results that were available during my care of the patient were reviewed by me and considered in my medical decision making (see chart for details).    36 year old female presents with reports of worsening sore throat and lack of improvement.  The patient continues to appear to have a viral illness.  Her sore throat is coming from postnasal drip.  Rapid strep again negative today.  Her culture is still pending from her prior encounter.  I advised supportive care and over-the-counter Flonase.  The patient seemed displeased with this and was unhappy that she did not get an antibiotic.  Final Clinical Impressions(s) / UC Diagnoses   Final diagnoses:  Influenza-like illness    ED Discharge Orders    None     Controlled Substance Prescriptions [place] Controlled Substance Registry consulted? Not Applicable   Tommie SamsCook, Maleko Greulich G, DO 12/21/16 1143

## 2016-12-21 NOTE — ED Triage Notes (Addendum)
Per patient seen x 2 days ago for cough / flu like symptoms. However patient stated not feeling any better. Per patient sore throat worse.

## 2016-12-21 NOTE — Discharge Instructions (Signed)
Flonase, rest, fluids, Ibuprofen.  This is viral.  Take care  Dr. Adriana Simasook

## 2016-12-22 LAB — CULTURE, GROUP A STREP (THRC)

## 2017-02-13 ENCOUNTER — Ambulatory Visit: Admit: 2017-02-13 | Discharge: 2017-02-14 | Payer: PRIVATE HEALTH INSURANCE

## 2017-02-13 DIAGNOSIS — E282 Polycystic ovarian syndrome: Principal | ICD-10-CM

## 2017-02-13 DIAGNOSIS — E785 Hyperlipidemia, unspecified: Secondary | ICD-10-CM

## 2017-02-13 DIAGNOSIS — R7989 Other specified abnormal findings of blood chemistry: Secondary | ICD-10-CM

## 2017-02-13 DIAGNOSIS — R69 Illness, unspecified: Secondary | ICD-10-CM

## 2017-03-03 MED ORDER — CETIRIZINE 5 MG-PSEUDOEPHEDRINE ER 120 MG TABLET,EXTENDED RELEASE,12HR
ORAL_TABLET | Freq: Two times a day (BID) | ORAL | 1 refills | 0 days | Status: CP
Start: 2017-03-03 — End: 2018-01-08

## 2017-03-04 ENCOUNTER — Other Ambulatory Visit: Admit: 2017-03-04 | Discharge: 2017-03-05 | Payer: PRIVATE HEALTH INSURANCE

## 2017-03-04 DIAGNOSIS — R7989 Other specified abnormal findings of blood chemistry: Principal | ICD-10-CM

## 2017-08-15 ENCOUNTER — Ambulatory Visit: Admit: 2017-08-15 | Discharge: 2017-08-16 | Payer: PRIVATE HEALTH INSURANCE

## 2017-08-15 DIAGNOSIS — E282 Polycystic ovarian syndrome: Principal | ICD-10-CM

## 2017-08-15 DIAGNOSIS — J4520 Mild intermittent asthma, uncomplicated: Secondary | ICD-10-CM

## 2017-08-15 DIAGNOSIS — M7661 Achilles tendinitis, right leg: Secondary | ICD-10-CM

## 2017-08-15 MED ORDER — NORGESTREL 0.3 MG-ETHINYL ESTRADIOL 30 MCG TABLET
ORAL_TABLET | Freq: Every day | ORAL | 4 refills | 0 days | Status: CP
Start: 2017-08-15 — End: ?

## 2017-08-15 MED ORDER — METFORMIN 500 MG TABLET
ORAL_TABLET | Freq: Two times a day (BID) | ORAL | 3 refills | 0 days | Status: CP
Start: 2017-08-15 — End: ?

## 2017-08-15 MED ORDER — ALBUTEROL SULFATE HFA 90 MCG/ACTUATION AEROSOL INHALER
Freq: Four times a day (QID) | RESPIRATORY_TRACT | 12 refills | 0.00000 days | Status: CP | PRN
Start: 2017-08-15 — End: ?

## 2017-10-06 ENCOUNTER — Ambulatory Visit
Admission: EM | Admit: 2017-10-06 | Discharge: 2017-10-06 | Disposition: A | Discharge status: Home / Self Care | Payer: BC Managed Care – PPO | Attending: Family Medicine | Admitting: Family Medicine

## 2017-10-06 ENCOUNTER — Encounter: Payer: Self-pay | Admitting: Emergency Medicine

## 2017-10-06 ENCOUNTER — Other Ambulatory Visit: Payer: Self-pay

## 2017-10-06 DIAGNOSIS — M62830 Muscle spasm of back: Secondary | ICD-10-CM | POA: Diagnosis not present

## 2017-10-06 MED ORDER — ORPHENADRINE CITRATE ER 100 MG PO TB12: 100.0000 mg | ORAL_TABLET | Freq: Two times a day (BID) | ORAL | 0 refills | Status: AC

## 2017-10-06 MED ORDER — MELOXICAM 15 MG PO TABS: 15.0000 mg | ORAL_TABLET | Freq: Every day | ORAL | 0 refills | Status: AC

## 2017-10-06 NOTE — ED Provider Notes (Signed)
[facility]    CSN: [id number] Arrival date & time: 10/06/17  1713     History   Chief Complaint Chief Complaint  Patient presents with  . Back Pain    HPI Hailey Evans is a 37 y.o. female.   Patient is a 37 year old female who presents with complaint of back pain that started this morning.  Patient states that seem like she has back pain issues about every time this year.  Patient states the pain feels muscular and is worse when she is getting up or down from a sitting to standing position or twisting and that it feels like muscle spasms while she walks.  Patient states she went to bed and was fine last night without any complaints but woke up with severe pain this morning.  Patient does not report feeling more tired recently, increased stress and tension at work.  Patient denies any trauma or any heavy lifting last couple days.  She does report that she can move quite a bit of sleep but does not believe she did anything to her back while sleeping.  She works as a Print production planner at [employer].  She states she did take some ibuprofen this morning which helped a little bit.  She also been using a essential oil ointment with some help as well.     Past Medical History:  Diagnosis Date  . Anxiety   . Asthma   . Diabetes mellitus without complication (HCC)   . Fatty liver     There are no active problems to display for this patient.   History reviewed. No pertinent surgical history.  OB History   None      Home Medications    Prior to Admission medications   Medication Sig Start Date End Date Taking? Authorizing Provider  metFORMIN (GLUCOPHAGE) 500 MG tablet Take 500 mg by mouth 2 (two) times daily with a meal.   Yes [provider]  Norgestrel-Ethinyl Estradiol (LOW-OGESTREL PO) Take by mouth.   Yes [provider]  albuterol (PROVENTIL HFA) 108 (90 BASE) MCG/ACT inhaler Inhale into the lungs every 6 (six) hours as needed for wheezing or shortness  of breath.    [provider]  meloxicam (MOBIC) 15 MG tablet Take 1 tablet (15 mg total) by mouth daily. 10/06/17   Candis Schatz, PA-C  orphenadrine (NORFLEX) 100 MG tablet Take 1 tablet (100 mg total) by mouth 2 (two) times daily. 10/06/17   Candis Schatz, PA-C    Family History Family History  Problem Relation Age of Onset  . Stroke Father     Social History Social History   Tobacco Use  . Smoking status: Never Smoker  . Smokeless tobacco: Never Used  Substance Use Topics  . Alcohol use: Yes  . Drug use: No     Allergies   Klonopin [clonazepam]; Lexapro [escitalopram]; and Tramadol   Review of Systems Review of Systems as noted in HPI.  Other systems reviewed and found to be negative.   Physical Exam Triage Vital Signs ED Triage Vitals  Enc Vitals Group     BP 10/06/17 1726 (!) 142/93     Pulse Rate 10/06/17 1726 86     Resp 10/06/17 1726 14     Temp 10/06/17 1726 98.5 F (36.9 C)     Temp Source 10/06/17 1726 Oral     SpO2 10/06/17 1726 99 %     Weight 10/06/17 1723 235 lb (106.6 kg)  Height 10/06/17 1723 5\' 7"  (1.702 m)     Head Circumference --      Peak Flow --      Pain Score 10/06/17 1723 7     Pain Loc --      Pain Edu? --      Excl. in GC? --    No data found.  Updated Vital Signs BP (!) 142/93 (BP Location: Left Arm)   Pulse 86   Temp 98.5 F (36.9 C) (Oral)   Resp 14   Ht 5\' 7"  (1.702 m)   Wt 235 lb (106.6 kg)   LMP 09/22/2017 (Approximate)   SpO2 99%   BMI 36.81 kg/m   Physical Exam  Constitutional: She is oriented to person, place, and time. She appears well-developed and well-nourished. No distress.  HENT:  Head: Atraumatic.  Eyes: Pupils are equal, round, and reactive to light. EOM are normal.  Neck: Normal range of motion.  Pulmonary/Chest: Effort normal and breath sounds normal.  Musculoskeletal:       Right shoulder: She exhibits normal range of motion, no tenderness, no deformity and no spasm.  Patient  with good range of motion in all directions.  No tenderness to palpation of the right or left lower back or paraspinal muscles.  Patient points to the area of the right lower back but states the pain only occurs when she is moving, twisting or getting up and down.  It does not hurt to palpation.  Neurological: She is alert and oriented to person, place, and time. No cranial nerve deficit.  Skin: Skin is warm and dry.     UC Treatments / Results  Labs (all labs ordered are listed, but only abnormal results are displayed) Labs Reviewed - No data to display  EKG None  Radiology No results found.  Procedures Procedures (including critical care time)  Medications Ordered in UC Medications - No data to display  Initial Impression / Assessment and Plan / UC Course  I have reviewed the triage vital signs and the nursing notes.  Pertinent labs & imaging results that were available during my care of the patient were reviewed by me and considered in my medical decision making (see chart for details).     Patient with back pain upon getting up and down her twisting movements.  However there is no tenderness to palpation of the lower back or the paraspinous muscles.  We will give her prescription for Mobic as well as for Norflex for spasms.  Also give her back exercises to help stretch.  Follow-up with PCP as needed. Final Clinical Impressions(s) / UC Diagnoses   Final diagnoses:  Muscle spasm of back     Discharge Instructions     -meloxicam: one tablet daily for pain. Do not take Advil, iburprofen, Aleve, naproxen or similar medication while taking the meloxicam -Norflex: one tablet twice a day as needed for spasms -back exercises as attached -can also try heat compress -return to clinic or see PCP if no improvement    ED Prescriptions    Medication Sig Dispense Auth. Provider   meloxicam (MOBIC) 15 MG tablet Take 1 tablet (15 mg total) by mouth daily. 30 tablet Candis Schatz, PA-C   orphenadrine (NORFLEX) 100 MG tablet Take 1 tablet (100 mg total) by mouth 2 (two) times daily. 30 tablet Candis Schatz, PA-C     Controlled Substance Prescriptions [place] Controlled Substance Registry consulted? Not Applicable      Candis Schatz,  PA-C 10/06/17 1757

## 2017-10-06 NOTE — ED Triage Notes (Signed)
Patient c/o lower back when she woke up this morning.  Patient reports muscle spasms.  Patient reports pain is worse when she gets up from a sitting position and when walking.

## 2017-10-06 NOTE — Discharge Instructions (Addendum)
-  meloxicam: one tablet daily for pain. Do not take Advil, iburprofen, Aleve, naproxen or similar medication while taking the meloxicam -Norflex: one tablet twice a day as needed for spasms -back exercises as attached -can also try heat compress -return to clinic or see PCP if no improvement

## 2018-01-08 MED ORDER — CETIRIZINE 5 MG-PSEUDOEPHEDRINE ER 120 MG TABLET,EXTENDED RELEASE,12HR
ORAL_TABLET | Freq: Two times a day (BID) | ORAL | 1 refills | 0.00000 days | Status: CP
Start: 2018-01-08 — End: 2019-01-09

## 2018-02-13 ENCOUNTER — Ambulatory Visit: Admit: 2018-02-13 | Discharge: 2018-02-14 | Payer: PRIVATE HEALTH INSURANCE

## 2018-02-13 DIAGNOSIS — E282 Polycystic ovarian syndrome: Principal | ICD-10-CM

## 2018-02-13 DIAGNOSIS — Z6841 Body Mass Index (BMI) 40.0 and over, adult: Secondary | ICD-10-CM

## 2018-06-02 ENCOUNTER — Telehealth
Admit: 2018-06-02 | Discharge: 2018-06-03 | Payer: PRIVATE HEALTH INSURANCE | Attending: Family Medicine | Primary: Family Medicine

## 2018-06-02 ENCOUNTER — Institutional Professional Consult (permissible substitution)
Admit: 2018-06-02 | Discharge: 2018-06-03 | Payer: PRIVATE HEALTH INSURANCE | Attending: Registered" | Primary: Registered"

## 2018-06-02 DIAGNOSIS — E282 Polycystic ovarian syndrome: Principal | ICD-10-CM

## 2018-06-02 DIAGNOSIS — E669 Obesity, unspecified: Principal | ICD-10-CM

## 2018-06-02 DIAGNOSIS — Z6841 Body Mass Index (BMI) 40.0 and over, adult: Secondary | ICD-10-CM

## 2018-06-02 DIAGNOSIS — F5081 Binge eating disorder: Secondary | ICD-10-CM

## 2018-06-02 DIAGNOSIS — R7303 Prediabetes: Secondary | ICD-10-CM

## 2018-06-02 DIAGNOSIS — K760 Fatty (change of) liver, not elsewhere classified: Secondary | ICD-10-CM

## 2018-06-02 MED ORDER — TOPIRAMATE 25 MG TABLET
ORAL_TABLET | Freq: Every evening | ORAL | 1 refills | 0 days | Status: CP
Start: 2018-06-02 — End: 2018-08-18

## 2018-06-03 ENCOUNTER — Telehealth: Admit: 2018-06-03 | Discharge: 2018-06-04 | Payer: PRIVATE HEALTH INSURANCE

## 2018-06-03 DIAGNOSIS — F419 Anxiety disorder, unspecified: Principal | ICD-10-CM

## 2018-06-03 MED ORDER — ALPRAZOLAM 0.25 MG TABLET
ORAL_TABLET | Freq: Three times a day (TID) | ORAL | 0 refills | 0.00000 days | Status: CP | PRN
Start: 2018-06-03 — End: 2019-06-03

## 2018-06-30 ENCOUNTER — Telehealth
Admit: 2018-06-30 | Discharge: 2018-06-30 | Payer: PRIVATE HEALTH INSURANCE | Attending: Family Medicine | Primary: Family Medicine

## 2018-06-30 ENCOUNTER — Telehealth
Admit: 2018-06-30 | Discharge: 2018-06-30 | Payer: PRIVATE HEALTH INSURANCE | Attending: Registered" | Primary: Registered"

## 2018-06-30 DIAGNOSIS — E669 Obesity, unspecified: Principal | ICD-10-CM

## 2018-06-30 DIAGNOSIS — F5081 Binge eating disorder: Secondary | ICD-10-CM

## 2018-08-18 ENCOUNTER — Ambulatory Visit
Admit: 2018-08-18 | Discharge: 2018-08-19 | Payer: PRIVATE HEALTH INSURANCE | Attending: Registered" | Primary: Registered"

## 2018-08-18 ENCOUNTER — Ambulatory Visit
Admit: 2018-08-18 | Discharge: 2018-08-19 | Payer: PRIVATE HEALTH INSURANCE | Attending: Family Medicine | Primary: Family Medicine

## 2018-08-18 DIAGNOSIS — E669 Obesity, unspecified: Principal | ICD-10-CM

## 2018-08-18 DIAGNOSIS — R7303 Prediabetes: Principal | ICD-10-CM

## 2018-08-18 MED ORDER — TOPIRAMATE 25 MG TABLET
ORAL_TABLET | Freq: Every evening | ORAL | 1 refills | 30.00000 days | Status: CP
Start: 2018-08-18 — End: ?

## 2018-09-29 ENCOUNTER — Ambulatory Visit
Admit: 2018-09-29 | Discharge: 2018-09-29 | Payer: PRIVATE HEALTH INSURANCE | Attending: Registered" | Primary: Registered"

## 2018-09-29 ENCOUNTER — Ambulatory Visit
Admit: 2018-09-29 | Discharge: 2018-09-29 | Payer: PRIVATE HEALTH INSURANCE | Attending: Family Medicine | Primary: Family Medicine

## 2018-09-29 DIAGNOSIS — E282 Polycystic ovarian syndrome: Secondary | ICD-10-CM

## 2018-09-29 DIAGNOSIS — E669 Obesity, unspecified: Secondary | ICD-10-CM

## 2018-09-29 DIAGNOSIS — F5081 Binge eating disorder: Secondary | ICD-10-CM

## 2018-09-29 DIAGNOSIS — F419 Anxiety disorder, unspecified: Secondary | ICD-10-CM

## 2018-09-29 MED ORDER — TOPIRAMATE 25 MG TABLET
ORAL_TABLET | Freq: Every evening | ORAL | 1 refills | 60.00000 days | Status: CP
Start: 2018-09-29 — End: ?

## 2018-10-13 DIAGNOSIS — E282 Polycystic ovarian syndrome: Secondary | ICD-10-CM

## 2018-11-10 ENCOUNTER — Ambulatory Visit
Admit: 2018-11-10 | Discharge: 2018-11-10 | Payer: PRIVATE HEALTH INSURANCE | Attending: Registered" | Primary: Registered"

## 2018-11-10 ENCOUNTER — Ambulatory Visit
Admit: 2018-11-10 | Discharge: 2018-11-10 | Payer: PRIVATE HEALTH INSURANCE | Attending: Family Medicine | Primary: Family Medicine

## 2018-11-10 DIAGNOSIS — E669 Obesity, unspecified: Principal | ICD-10-CM

## 2018-11-10 MED ORDER — TOPIRAMATE 25 MG TABLET: 50 mg | tablet | Freq: Every evening | 1 refills | 60 days | Status: AC

## 2018-11-11 DIAGNOSIS — E282 Polycystic ovarian syndrome: Principal | ICD-10-CM

## 2018-11-12 MED ORDER — METFORMIN 500 MG TABLET: 500 mg | tablet | Freq: Two times a day (BID) | 1 refills | 90 days | Status: AC

## 2018-12-12 DIAGNOSIS — J309 Allergic rhinitis, unspecified: Principal | ICD-10-CM

## 2018-12-12 MED ORDER — CETIRIZINE 5 MG-PSEUDOEPHEDRINE ER 120 MG TABLET,EXTENDED RELEASE,12HR
ORAL_TABLET | Freq: Two times a day (BID) | ORAL | 1 refills | 90.00000 days | Status: CP
Start: 2018-12-12 — End: 2019-12-12

## 2019-01-12 ENCOUNTER — Telehealth
Admit: 2019-01-12 | Discharge: 2019-01-12 | Payer: PRIVATE HEALTH INSURANCE | Attending: Family Medicine | Primary: Family Medicine

## 2019-01-12 ENCOUNTER — Institutional Professional Consult (permissible substitution)
Admit: 2019-01-12 | Discharge: 2019-01-12 | Payer: PRIVATE HEALTH INSURANCE | Attending: Registered" | Primary: Registered"

## 2019-01-12 DIAGNOSIS — E669 Obesity, unspecified: Principal | ICD-10-CM

## 2019-02-10 MED ORDER — VYVANSE 10 MG CAPSULE
ORAL_CAPSULE | Freq: Every day | ORAL | 0 refills | 0.00000 days | Status: CP
Start: 2019-02-10 — End: 2020-01-19

## 2019-03-16 ENCOUNTER — Ambulatory Visit
Admit: 2019-03-16 | Discharge: 2019-03-17 | Payer: PRIVATE HEALTH INSURANCE | Attending: Registered" | Primary: Registered"

## 2019-03-16 ENCOUNTER — Ambulatory Visit
Admit: 2019-03-16 | Discharge: 2019-03-17 | Payer: PRIVATE HEALTH INSURANCE | Attending: Family Medicine | Primary: Family Medicine

## 2019-03-16 DIAGNOSIS — F5081 Binge eating disorder: Principal | ICD-10-CM

## 2019-03-16 DIAGNOSIS — E669 Obesity, unspecified: Principal | ICD-10-CM

## 2019-03-16 MED ORDER — LISDEXAMFETAMINE 20 MG CAPSULE
ORAL_CAPSULE | Freq: Every morning | ORAL | 0 refills | 30.00000 days | Status: CP
Start: 2019-03-16 — End: ?

## 2019-04-16 ENCOUNTER — Ambulatory Visit: Admit: 2019-04-16 | Discharge: 2019-04-17 | Payer: PRIVATE HEALTH INSURANCE

## 2019-04-27 DIAGNOSIS — F5081 Binge eating disorder: Principal | ICD-10-CM

## 2019-04-27 DIAGNOSIS — E669 Obesity, unspecified: Principal | ICD-10-CM

## 2019-04-27 MED ORDER — LISDEXAMFETAMINE 20 MG CAPSULE
ORAL_CAPSULE | Freq: Every morning | ORAL | 0 refills | 30 days | Status: CP
Start: 2019-04-27 — End: 2019-05-26

## 2019-05-18 DIAGNOSIS — E282 Polycystic ovarian syndrome: Principal | ICD-10-CM

## 2019-05-24 DIAGNOSIS — E282 Polycystic ovarian syndrome: Principal | ICD-10-CM

## 2019-05-24 MED ORDER — METFORMIN 500 MG TABLET
ORAL_TABLET | Freq: Two times a day (BID) | ORAL | 0 refills | 90.00000 days | Status: CP
Start: 2019-05-24 — End: 2020-05-23

## 2019-05-27 MED ORDER — LISDEXAMFETAMINE 20 MG CAPSULE
ORAL_CAPSULE | Freq: Every morning | ORAL | 0 refills | 30.00000 days | Status: CP
Start: 2019-05-27 — End: 2019-06-25

## 2019-06-08 ENCOUNTER — Ambulatory Visit
Admit: 2019-06-08 | Discharge: 2019-06-08 | Payer: PRIVATE HEALTH INSURANCE | Attending: Family Medicine | Primary: Family Medicine

## 2019-06-08 ENCOUNTER — Ambulatory Visit
Admit: 2019-06-08 | Discharge: 2019-06-08 | Payer: PRIVATE HEALTH INSURANCE | Attending: Registered" | Primary: Registered"

## 2019-06-08 DIAGNOSIS — E669 Obesity, unspecified: Principal | ICD-10-CM

## 2019-06-08 DIAGNOSIS — F5081 Binge eating disorder: Principal | ICD-10-CM

## 2019-06-08 MED ORDER — LISDEXAMFETAMINE 30 MG CAPSULE
ORAL_CAPSULE | Freq: Every morning | ORAL | 0 refills | 30 days | Status: CP
Start: 2019-06-08 — End: ?

## 2019-06-29 ENCOUNTER — Institutional Professional Consult (permissible substitution)
Admit: 2019-06-29 | Discharge: 2019-06-30 | Payer: PRIVATE HEALTH INSURANCE | Attending: Registered" | Primary: Registered"

## 2019-06-29 DIAGNOSIS — E669 Obesity, unspecified: Principal | ICD-10-CM

## 2019-07-07 ENCOUNTER — Ambulatory Visit: Admit: 2019-07-07 | Discharge: 2019-07-08 | Payer: PRIVATE HEALTH INSURANCE

## 2019-07-07 DIAGNOSIS — E785 Hyperlipidemia, unspecified: Principal | ICD-10-CM

## 2019-07-07 DIAGNOSIS — E282 Polycystic ovarian syndrome: Principal | ICD-10-CM

## 2019-07-07 DIAGNOSIS — Z1159 Encounter for screening for other viral diseases: Principal | ICD-10-CM

## 2019-07-07 DIAGNOSIS — K760 Fatty (change of) liver, not elsewhere classified: Principal | ICD-10-CM

## 2019-07-07 DIAGNOSIS — J4520 Mild intermittent asthma, uncomplicated: Principal | ICD-10-CM

## 2019-07-07 MED ORDER — METFORMIN 500 MG TABLET
ORAL_TABLET | Freq: Two times a day (BID) | ORAL | 3 refills | 90.00000 days | Status: CP
Start: 2019-07-07 — End: 2020-07-06

## 2019-07-09 MED ORDER — LISDEXAMFETAMINE 30 MG CAPSULE
ORAL_CAPSULE | Freq: Every morning | ORAL | 0 refills | 30.00000 days | Status: CP
Start: 2019-07-09 — End: ?

## 2019-07-14 ENCOUNTER — Ambulatory Visit
Admit: 2019-07-14 | Discharge: 2019-07-14 | Payer: PRIVATE HEALTH INSURANCE | Attending: Family Medicine | Primary: Family Medicine

## 2019-07-14 ENCOUNTER — Ambulatory Visit
Admit: 2019-07-14 | Discharge: 2019-07-14 | Payer: PRIVATE HEALTH INSURANCE | Attending: Registered" | Primary: Registered"

## 2019-07-14 DIAGNOSIS — E669 Obesity, unspecified: Principal | ICD-10-CM

## 2019-07-14 DIAGNOSIS — K760 Fatty (change of) liver, not elsewhere classified: Principal | ICD-10-CM

## 2019-07-14 DIAGNOSIS — F5081 Binge eating disorder: Principal | ICD-10-CM

## 2019-07-14 DIAGNOSIS — Z1159 Encounter for screening for other viral diseases: Principal | ICD-10-CM

## 2019-07-14 MED ORDER — LISDEXAMFETAMINE 40 MG CAPSULE
ORAL_CAPSULE | Freq: Every morning | ORAL | 0 refills | 30.00000 days | Status: CP
Start: 2019-07-14 — End: ?

## 2019-07-14 MED ORDER — VYVANSE 10 MG CAPSULE
ORAL_CAPSULE | Freq: Every day | ORAL | 0 refills | 0.00000 days | Status: CP
Start: 2019-07-14 — End: ?

## 2019-08-22 DIAGNOSIS — E282 Polycystic ovarian syndrome: Principal | ICD-10-CM

## 2019-08-23 MED ORDER — METFORMIN 500 MG TABLET
ORAL_TABLET | 3 refills | 0 days | Status: CP
Start: 2019-08-23 — End: ?

## 2019-09-13 DIAGNOSIS — F419 Anxiety disorder, unspecified: Principal | ICD-10-CM

## 2019-09-14 MED ORDER — ALPRAZOLAM 0.25 MG TABLET
ORAL_TABLET | 0 refills | 0 days | Status: CP
Start: 2019-09-14 — End: ?

## 2019-09-16 MED ORDER — LISDEXAMFETAMINE 40 MG CAPSULE
ORAL_CAPSULE | Freq: Every morning | ORAL | 0 refills | 30.00000 days | Status: CP
Start: 2019-09-16 — End: ?

## 2019-10-05 ENCOUNTER — Ambulatory Visit
Admit: 2019-10-05 | Discharge: 2019-10-05 | Payer: PRIVATE HEALTH INSURANCE | Attending: Family Medicine | Primary: Family Medicine

## 2019-10-05 ENCOUNTER — Ambulatory Visit
Admit: 2019-10-05 | Discharge: 2019-10-05 | Payer: PRIVATE HEALTH INSURANCE | Attending: Registered" | Primary: Registered"

## 2019-10-05 DIAGNOSIS — R7303 Prediabetes: Principal | ICD-10-CM

## 2019-10-05 DIAGNOSIS — F5081 Binge eating disorder: Principal | ICD-10-CM

## 2019-10-05 DIAGNOSIS — E669 Obesity, unspecified: Principal | ICD-10-CM

## 2019-10-05 DIAGNOSIS — Z23 Encounter for immunization: Principal | ICD-10-CM

## 2019-10-05 MED ORDER — LISDEXAMFETAMINE 20 MG CAPSULE
ORAL_CAPSULE | Freq: Every morning | ORAL | 0 refills | 30 days | Status: CP
Start: 2019-10-05 — End: ?

## 2019-10-05 MED ORDER — SEMAGLUTIDE 0.25 MG OR 0.5 MG (2 MG/1.5 ML) SUBCUTANEOUS PEN INJECTOR
SUBCUTANEOUS | 3 refills | 147 days | Status: CP
Start: 2019-10-05 — End: ?

## 2019-11-01 MED ORDER — ZYRTEC-D 5 MG-120 MG TABLET,EXTENDED RELEASE
ORAL_TABLET | 0 refills | 0 days | Status: CP
Start: 2019-11-01 — End: ?

## 2019-11-02 ENCOUNTER — Ambulatory Visit
Admit: 2019-11-02 | Discharge: 2019-11-03 | Payer: PRIVATE HEALTH INSURANCE | Attending: Family Medicine | Primary: Family Medicine

## 2019-11-02 DIAGNOSIS — F5081 Binge eating disorder: Principal | ICD-10-CM

## 2019-11-02 DIAGNOSIS — R7303 Prediabetes: Principal | ICD-10-CM

## 2019-11-02 DIAGNOSIS — E669 Obesity, unspecified: Principal | ICD-10-CM

## 2019-11-02 MED ORDER — LISDEXAMFETAMINE 10 MG CAPSULE
ORAL_CAPSULE | Freq: Every morning | ORAL | 0 refills | 0 days | Status: CP
Start: 2019-11-02 — End: ?

## 2019-11-02 MED ORDER — SEMAGLUTIDE 0.25 MG OR 0.5 MG (2 MG/1.5 ML) SUBCUTANEOUS PEN INJECTOR
SUBCUTANEOUS | 2 refills | 70.00000 days | Status: CP
Start: 2019-11-02 — End: ?

## 2019-11-15 ENCOUNTER — Telehealth
Admit: 2019-11-15 | Discharge: 2019-12-14 | Payer: PRIVATE HEALTH INSURANCE | Attending: Mental Health | Primary: Mental Health

## 2019-11-15 DIAGNOSIS — F5081 Binge eating disorder: Principal | ICD-10-CM

## 2019-11-30 ENCOUNTER — Ambulatory Visit
Admit: 2019-11-30 | Discharge: 2019-11-30 | Payer: PRIVATE HEALTH INSURANCE | Attending: Family Medicine | Primary: Family Medicine

## 2019-11-30 ENCOUNTER — Ambulatory Visit
Admit: 2019-11-30 | Discharge: 2019-11-30 | Payer: PRIVATE HEALTH INSURANCE | Attending: Registered" | Primary: Registered"

## 2019-11-30 DIAGNOSIS — E669 Obesity, unspecified: Principal | ICD-10-CM

## 2019-11-30 DIAGNOSIS — F5081 Binge eating disorder: Principal | ICD-10-CM

## 2019-11-30 DIAGNOSIS — R7303 Prediabetes: Principal | ICD-10-CM

## 2019-11-30 MED ORDER — SEMAGLUTIDE 1 MG/DOSE (2 MG/1.5 ML) SUBCUTANEOUS PEN INJECTOR
SUBCUTANEOUS | 1 refills | 35.00000 days | Status: CP
Start: 2019-11-30 — End: 2020-01-10

## 2019-11-30 MED ORDER — VYVANSE 10 MG CAPSULE
ORAL_CAPSULE | Freq: Every morning | ORAL | 0 refills | 0.00000 days | Status: CP
Start: 2019-11-30 — End: 2020-01-10

## 2019-12-28 DIAGNOSIS — J4520 Mild intermittent asthma, uncomplicated: Principal | ICD-10-CM

## 2019-12-28 MED ORDER — ALBUTEROL SULFATE HFA 90 MCG/ACTUATION AEROSOL INHALER
Freq: Four times a day (QID) | RESPIRATORY_TRACT | 2 refills | 0 days | Status: CP | PRN
Start: 2019-12-28 — End: 2020-12-27

## 2020-01-10 ENCOUNTER — Institutional Professional Consult (permissible substitution)
Admit: 2020-01-10 | Discharge: 2020-01-10 | Payer: PRIVATE HEALTH INSURANCE | Attending: Registered" | Primary: Registered"

## 2020-01-10 ENCOUNTER — Ambulatory Visit
Admit: 2020-01-10 | Discharge: 2020-01-10 | Payer: PRIVATE HEALTH INSURANCE | Attending: Family Medicine | Primary: Family Medicine

## 2020-01-10 DIAGNOSIS — R7303 Prediabetes: Principal | ICD-10-CM

## 2020-01-10 DIAGNOSIS — E669 Obesity, unspecified: Principal | ICD-10-CM

## 2020-01-10 DIAGNOSIS — F5081 Binge eating disorder: Principal | ICD-10-CM

## 2020-01-10 MED ORDER — VYVANSE 10 MG CAPSULE
ORAL_CAPSULE | Freq: Every day | ORAL | 0 refills | 0.00000 days | Status: CP
Start: 2020-01-10 — End: 2020-01-19

## 2020-01-10 MED ORDER — WEGOVY 1.7 MG/0.75 ML SUBCUTANEOUS PEN INJECTOR
SUBCUTANEOUS | 3 refills | 0 days | Status: CP
Start: 2020-01-10 — End: ?

## 2020-01-10 MED ORDER — VYVANSE 10 MG CAPSULE: 10 mg | capsule | Freq: Every morning | 0 refills | 0 days | Status: AC

## 2020-01-19 ENCOUNTER — Ambulatory Visit: Admit: 2020-01-19 | Discharge: 2020-01-20 | Payer: PRIVATE HEALTH INSURANCE

## 2020-01-19 DIAGNOSIS — F419 Anxiety disorder, unspecified: Principal | ICD-10-CM

## 2020-01-19 DIAGNOSIS — E669 Obesity, unspecified: Principal | ICD-10-CM

## 2020-01-19 DIAGNOSIS — R030 Elevated blood-pressure reading, without diagnosis of hypertension: Principal | ICD-10-CM

## 2020-01-19 DIAGNOSIS — E282 Polycystic ovarian syndrome: Principal | ICD-10-CM

## 2020-01-19 DIAGNOSIS — E785 Hyperlipidemia, unspecified: Principal | ICD-10-CM

## 2020-01-19 DIAGNOSIS — J4520 Mild intermittent asthma, uncomplicated: Principal | ICD-10-CM

## 2020-01-19 DIAGNOSIS — K760 Fatty (change of) liver, not elsewhere classified: Principal | ICD-10-CM

## 2020-01-19 DIAGNOSIS — Z7689 Persons encountering health services in other specified circumstances: Principal | ICD-10-CM

## 2020-01-19 DIAGNOSIS — J301 Allergic rhinitis due to pollen: Principal | ICD-10-CM

## 2020-01-19 DIAGNOSIS — R7303 Prediabetes: Principal | ICD-10-CM

## 2020-01-19 DIAGNOSIS — F32A Depression, unspecified: Principal | ICD-10-CM

## 2020-01-19 DIAGNOSIS — E559 Vitamin D deficiency, unspecified: Principal | ICD-10-CM

## 2020-01-19 MED ORDER — CETIRIZINE 5 MG-PSEUDOEPHEDRINE ER 120 MG TABLET,EXTENDED RELEASE,12HR
ORAL_TABLET | Freq: Two times a day (BID) | ORAL | 0 refills | 90 days | Status: CP
Start: 2020-01-19 — End: ?

## 2020-02-02 DIAGNOSIS — R7303 Prediabetes: Principal | ICD-10-CM

## 2020-03-14 ENCOUNTER — Ambulatory Visit
Admit: 2020-03-14 | Discharge: 2020-03-15 | Payer: PRIVATE HEALTH INSURANCE | Attending: Registered" | Primary: Registered"

## 2020-03-14 ENCOUNTER — Ambulatory Visit
Admit: 2020-03-14 | Discharge: 2020-03-15 | Payer: PRIVATE HEALTH INSURANCE | Attending: Family Medicine | Primary: Family Medicine

## 2020-03-14 DIAGNOSIS — E669 Obesity, unspecified: Principal | ICD-10-CM

## 2020-03-14 DIAGNOSIS — F5081 Binge eating disorder: Principal | ICD-10-CM

## 2020-03-14 DIAGNOSIS — R7303 Prediabetes: Principal | ICD-10-CM

## 2020-03-14 MED ORDER — VYVANSE 10 MG CAPSULE
ORAL_CAPSULE | Freq: Every morning | ORAL | 0 refills | 0.00000 days | Status: CP
Start: 2020-03-14 — End: ?

## 2020-04-11 MED ORDER — VYVANSE 10 MG CAPSULE
ORAL_CAPSULE | Freq: Every day | ORAL | 0 refills | 0.00000 days | Status: CP
Start: 2020-04-11 — End: ?

## 2020-05-02 ENCOUNTER — Ambulatory Visit: Admit: 2020-05-02 | Discharge: 2020-05-03 | Payer: PRIVATE HEALTH INSURANCE

## 2020-05-02 DIAGNOSIS — R110 Nausea: Principal | ICD-10-CM

## 2020-05-02 DIAGNOSIS — R197 Diarrhea, unspecified: Principal | ICD-10-CM

## 2020-05-02 DIAGNOSIS — R1084 Generalized abdominal pain: Principal | ICD-10-CM

## 2020-05-02 MED ORDER — ONDANSETRON 4 MG DISINTEGRATING TABLET
ORAL_TABLET | Freq: Three times a day (TID) | ORAL | 0 refills | 5.00000 days | Status: CP | PRN
Start: 2020-05-02 — End: 2020-05-07

## 2020-05-02 MED ORDER — DICYCLOMINE 20 MG TABLET
ORAL_TABLET | Freq: Two times a day (BID) | ORAL | 0 refills | 5 days | Status: CP | PRN
Start: 2020-05-02 — End: 2020-05-07

## 2020-05-12 MED ORDER — WEGOVY 1.7 MG/0.75 ML SUBCUTANEOUS PEN INJECTOR
SUBCUTANEOUS | 3 refills | 35.00000 days | Status: CP
Start: 2020-05-12 — End: ?

## 2020-05-15 ENCOUNTER — Institutional Professional Consult (permissible substitution)
Admit: 2020-05-15 | Discharge: 2020-05-16 | Payer: PRIVATE HEALTH INSURANCE | Attending: Registered" | Primary: Registered"

## 2020-05-16 MED ORDER — WEGOVY 1.7 MG/0.75 ML SUBCUTANEOUS PEN INJECTOR
SUBCUTANEOUS | 0 refills | 0.00000 days | Status: CP
Start: 2020-05-16 — End: ?

## 2020-05-18 ENCOUNTER — Ambulatory Visit
Admit: 2020-05-18 | Discharge: 2020-05-19 | Payer: PRIVATE HEALTH INSURANCE | Attending: Family Medicine | Primary: Family Medicine

## 2020-05-18 DIAGNOSIS — R7303 Prediabetes: Principal | ICD-10-CM

## 2020-05-18 DIAGNOSIS — F5081 Binge eating disorder: Principal | ICD-10-CM

## 2020-05-18 DIAGNOSIS — E669 Obesity, unspecified: Principal | ICD-10-CM

## 2020-05-18 MED ORDER — VYVANSE 10 MG CAPSULE
ORAL_CAPSULE | Freq: Every day | ORAL | 0 refills | 0.00000 days | Status: CP
Start: 2020-05-18 — End: ?

## 2020-06-13 MED ORDER — WEGOVY 1.7 MG/0.75 ML SUBCUTANEOUS PEN INJECTOR
SUBCUTANEOUS | 0 refills | 0.00000 days | Status: CP
Start: 2020-06-13 — End: ?

## 2020-07-17 MED ORDER — WEGOVY 1.7 MG/0.75 ML SUBCUTANEOUS PEN INJECTOR
SUBCUTANEOUS | 1 refills | 28 days | Status: CP
Start: 2020-07-17 — End: ?

## 2020-07-19 ENCOUNTER — Ambulatory Visit: Admit: 2020-07-19 | Discharge: 2020-07-20 | Payer: PRIVATE HEALTH INSURANCE

## 2020-07-19 DIAGNOSIS — R7303 Prediabetes: Principal | ICD-10-CM

## 2020-07-19 DIAGNOSIS — E282 Polycystic ovarian syndrome: Principal | ICD-10-CM

## 2020-07-19 DIAGNOSIS — R030 Elevated blood-pressure reading, without diagnosis of hypertension: Principal | ICD-10-CM

## 2020-07-19 DIAGNOSIS — F32A Depression, unspecified depression type: Principal | ICD-10-CM

## 2020-07-19 DIAGNOSIS — E785 Hyperlipidemia, unspecified: Principal | ICD-10-CM

## 2020-07-19 DIAGNOSIS — Z6831 Body mass index (BMI) 31.0-31.9, adult: Principal | ICD-10-CM

## 2020-07-19 DIAGNOSIS — E559 Vitamin D deficiency, unspecified: Principal | ICD-10-CM

## 2020-07-19 DIAGNOSIS — E6609 Other obesity due to excess calories: Principal | ICD-10-CM

## 2020-07-19 DIAGNOSIS — T781XXA Other adverse food reactions, not elsewhere classified, initial encounter: Principal | ICD-10-CM

## 2020-07-19 DIAGNOSIS — J4520 Mild intermittent asthma, uncomplicated: Principal | ICD-10-CM

## 2020-07-19 DIAGNOSIS — F419 Anxiety disorder, unspecified: Principal | ICD-10-CM

## 2020-07-23 DIAGNOSIS — F5081 Binge eating disorder: Principal | ICD-10-CM

## 2020-07-25 MED ORDER — VYVANSE 10 MG CAPSULE
ORAL_CAPSULE | Freq: Every morning | ORAL | 0 refills | 0.00000 days | Status: CP
Start: 2020-07-25 — End: ?

## 2020-08-18 DIAGNOSIS — E282 Polycystic ovarian syndrome: Principal | ICD-10-CM

## 2020-08-18 MED ORDER — METFORMIN 500 MG TABLET
ORAL_TABLET | Freq: Two times a day (BID) | ORAL | 3 refills | 90 days | Status: CP
Start: 2020-08-18 — End: ?

## 2020-08-24 ENCOUNTER — Ambulatory Visit
Admit: 2020-08-24 | Discharge: 2020-08-25 | Payer: PRIVATE HEALTH INSURANCE | Attending: Family Medicine | Primary: Family Medicine

## 2020-08-24 DIAGNOSIS — F5081 Binge eating disorder: Principal | ICD-10-CM

## 2020-08-24 DIAGNOSIS — E6609 Other obesity due to excess calories: Principal | ICD-10-CM

## 2020-08-24 DIAGNOSIS — R030 Elevated blood-pressure reading, without diagnosis of hypertension: Principal | ICD-10-CM

## 2020-08-24 DIAGNOSIS — Z6831 Body mass index (BMI) 31.0-31.9, adult: Principal | ICD-10-CM

## 2020-08-24 DIAGNOSIS — R7303 Prediabetes: Principal | ICD-10-CM

## 2020-08-24 MED ORDER — LISDEXAMFETAMINE 10 MG CAPSULE
ORAL_CAPSULE | Freq: Every morning | ORAL | 0 refills | 29.00000 days | Status: CP
Start: 2020-08-24 — End: 2020-09-22

## 2020-08-24 MED ORDER — WEGOVY 1.7 MG/0.75 ML SUBCUTANEOUS PEN INJECTOR
SUBCUTANEOUS | 3 refills | 28.00000 days | Status: CP
Start: 2020-08-24 — End: ?

## 2020-09-23 MED ORDER — LISDEXAMFETAMINE 10 MG CAPSULE
ORAL_CAPSULE | Freq: Every morning | ORAL | 0 refills | 29 days | Status: CP
Start: 2020-09-23 — End: 2020-10-22

## 2020-10-23 MED ORDER — LISDEXAMFETAMINE 10 MG CAPSULE
ORAL_CAPSULE | Freq: Every morning | ORAL | 0 refills | 29 days | Status: CP
Start: 2020-10-23 — End: 2020-11-21

## 2020-10-27 ENCOUNTER — Ambulatory Visit: Admit: 2020-10-27 | Discharge: 2020-10-28 | Payer: PRIVATE HEALTH INSURANCE

## 2020-10-27 DIAGNOSIS — R7303 Prediabetes: Principal | ICD-10-CM

## 2020-10-27 DIAGNOSIS — R21 Rash and other nonspecific skin eruption: Principal | ICD-10-CM

## 2020-10-27 MED ORDER — NYSTATIN 100,000 UNIT/GRAM TOPICAL CREAM
Freq: Two times a day (BID) | TOPICAL | 0 refills | 0 days | Status: CP
Start: 2020-10-27 — End: 2021-10-27

## 2020-10-27 MED ORDER — CLOTRIMAZOLE-BETAMETHASONE 1 %-0.05 % TOPICAL CREAM
1 refills | 0 days | Status: CP
Start: 2020-10-27 — End: 2021-10-27

## 2020-11-10 DIAGNOSIS — J301 Allergic rhinitis due to pollen: Principal | ICD-10-CM

## 2020-11-14 MED ORDER — CETIRIZINE 5 MG-PSEUDOEPHEDRINE ER 120 MG TABLET,EXTENDED RELEASE,12HR
ORAL_TABLET | Freq: Two times a day (BID) | ORAL | 0 refills | 90 days | Status: CP
Start: 2020-11-14 — End: ?

## 2020-11-28 ENCOUNTER — Ambulatory Visit
Admit: 2020-11-28 | Discharge: 2020-11-29 | Payer: PRIVATE HEALTH INSURANCE | Attending: Family Medicine | Primary: Family Medicine

## 2020-11-28 DIAGNOSIS — Z6831 Body mass index (BMI) 31.0-31.9, adult: Principal | ICD-10-CM

## 2020-11-28 DIAGNOSIS — E6609 Other obesity due to excess calories: Principal | ICD-10-CM

## 2020-11-28 DIAGNOSIS — R7303 Prediabetes: Principal | ICD-10-CM

## 2020-12-12 DIAGNOSIS — F5081 Binge eating disorder: Principal | ICD-10-CM

## 2020-12-12 DIAGNOSIS — M545 Acute low back pain, unspecified back pain laterality, unspecified whether sciatica present: Principal | ICD-10-CM

## 2020-12-13 DIAGNOSIS — F5081 Binge eating disorder: Principal | ICD-10-CM

## 2020-12-13 MED ORDER — VYVANSE 10 MG CAPSULE
ORAL_CAPSULE | Freq: Every day | ORAL | 0 refills | 0 days | Status: CP
Start: 2020-12-13 — End: ?

## 2020-12-20 ENCOUNTER — Ambulatory Visit: Admit: 2020-12-20 | Discharge: 2020-12-21 | Payer: PRIVATE HEALTH INSURANCE

## 2020-12-20 DIAGNOSIS — Z79899 Other long term (current) drug therapy: Principal | ICD-10-CM

## 2020-12-20 DIAGNOSIS — F5081 Binge eating disorder: Principal | ICD-10-CM

## 2020-12-20 DIAGNOSIS — R399 Unspecified symptoms and signs involving the genitourinary system: Principal | ICD-10-CM

## 2020-12-20 DIAGNOSIS — E6609 Other obesity due to excess calories: Principal | ICD-10-CM

## 2020-12-20 DIAGNOSIS — Z6831 Body mass index (BMI) 31.0-31.9, adult: Principal | ICD-10-CM

## 2020-12-20 DIAGNOSIS — M549 Dorsalgia, unspecified: Principal | ICD-10-CM

## 2020-12-22 DIAGNOSIS — N390 Urinary tract infection, site not specified: Principal | ICD-10-CM

## 2020-12-22 DIAGNOSIS — R319 Hematuria, unspecified: Principal | ICD-10-CM

## 2020-12-22 MED ORDER — NITROFURANTOIN MONOHYDRATE/MACROCRYSTALS 100 MG CAPSULE
ORAL_CAPSULE | Freq: Two times a day (BID) | ORAL | 0 refills | 7 days | Status: CP
Start: 2020-12-22 — End: 2020-12-29

## 2020-12-30 ENCOUNTER — Ambulatory Visit: Admit: 2020-12-30 | Discharge: 2020-12-31 | Payer: PRIVATE HEALTH INSURANCE

## 2021-01-02 DIAGNOSIS — R937 Abnormal findings on diagnostic imaging of other parts of musculoskeletal system: Principal | ICD-10-CM

## 2021-01-02 DIAGNOSIS — M5440 Lumbago with sciatica, unspecified side: Principal | ICD-10-CM

## 2021-01-11 MED ORDER — WEGOVY 1.7 MG/0.75 ML SUBCUTANEOUS PEN INJECTOR
SUBCUTANEOUS | 3 refills | 28.00000 days | Status: CP
Start: 2021-01-11 — End: ?

## 2021-01-15 DIAGNOSIS — F5081 Binge eating disorder: Principal | ICD-10-CM

## 2021-01-17 ENCOUNTER — Ambulatory Visit: Admit: 2021-01-17 | Discharge: 2021-01-18 | Payer: PRIVATE HEALTH INSURANCE

## 2021-01-17 DIAGNOSIS — M549 Dorsalgia, unspecified: Principal | ICD-10-CM

## 2021-01-17 DIAGNOSIS — F5081 Binge eating disorder: Principal | ICD-10-CM

## 2021-01-17 DIAGNOSIS — F419 Anxiety disorder, unspecified: Principal | ICD-10-CM

## 2021-01-17 DIAGNOSIS — E785 Hyperlipidemia, unspecified: Principal | ICD-10-CM

## 2021-01-17 MED ORDER — ALPRAZOLAM 0.25 MG TABLET
ORAL_TABLET | Freq: Three times a day (TID) | ORAL | 0 refills | 10 days | Status: CP | PRN
Start: 2021-01-17 — End: ?

## 2021-01-17 MED ORDER — VYVANSE 10 MG CAPSULE
ORAL_CAPSULE | Freq: Every day | ORAL | 0 refills | 0.00000 days | Status: CP
Start: 2021-01-17 — End: ?

## 2021-01-29 MED ORDER — CETIRIZINE 5 MG-PSEUDOEPHEDRINE ER 120 MG TABLET,EXTENDED RELEASE,12HR
ORAL_TABLET | 2 refills | 0 days | Status: CP
Start: 2021-01-29 — End: ?

## 2021-02-26 DIAGNOSIS — F5081 Binge eating disorder: Principal | ICD-10-CM

## 2021-02-27 MED ORDER — VYVANSE 10 MG CAPSULE
ORAL_CAPSULE | Freq: Every day | ORAL | 0 refills | 30 days | Status: CP
Start: 2021-02-27 — End: 2021-03-29

## 2021-03-01 ENCOUNTER — Ambulatory Visit
Admit: 2021-03-01 | Discharge: 2021-03-01 | Payer: PRIVATE HEALTH INSURANCE | Attending: Family Medicine | Primary: Family Medicine

## 2021-03-01 DIAGNOSIS — R7303 Prediabetes: Principal | ICD-10-CM

## 2021-03-01 DIAGNOSIS — E6609 Other obesity due to excess calories: Principal | ICD-10-CM

## 2021-03-01 DIAGNOSIS — Z6831 Body mass index (BMI) 31.0-31.9, adult: Principal | ICD-10-CM

## 2021-03-01 MED ORDER — WEGOVY 1.7 MG/0.75 ML SUBCUTANEOUS PEN INJECTOR
SUBCUTANEOUS | 3 refills | 28 days | Status: CP
Start: 2021-03-01 — End: ?

## 2021-03-27 DIAGNOSIS — F5081 Binge eating disorder: Principal | ICD-10-CM

## 2021-03-27 MED ORDER — VYVANSE 10 MG CAPSULE
ORAL_CAPSULE | Freq: Every day | ORAL | 0 refills | 30 days | Status: CP
Start: 2021-03-27 — End: 2021-04-26

## 2021-04-19 ENCOUNTER — Ambulatory Visit: Admit: 2021-04-19 | Discharge: 2021-04-20 | Payer: PRIVATE HEALTH INSURANCE

## 2021-04-19 DIAGNOSIS — Z1329 Encounter for screening for other suspected endocrine disorder: Principal | ICD-10-CM

## 2021-04-19 DIAGNOSIS — F419 Anxiety disorder, unspecified: Principal | ICD-10-CM

## 2021-04-19 DIAGNOSIS — E6609 Other obesity due to excess calories: Principal | ICD-10-CM

## 2021-04-19 DIAGNOSIS — Z0000 Encounter for general adult medical examination without abnormal findings: Principal | ICD-10-CM

## 2021-04-19 DIAGNOSIS — R7303 Prediabetes: Principal | ICD-10-CM

## 2021-04-19 DIAGNOSIS — E785 Hyperlipidemia, unspecified: Principal | ICD-10-CM

## 2021-04-19 DIAGNOSIS — J4520 Mild intermittent asthma, uncomplicated: Principal | ICD-10-CM

## 2021-04-19 DIAGNOSIS — E282 Polycystic ovarian syndrome: Principal | ICD-10-CM

## 2021-04-19 DIAGNOSIS — F32A Depression, unspecified depression type: Principal | ICD-10-CM

## 2021-04-19 DIAGNOSIS — E559 Vitamin D deficiency, unspecified: Principal | ICD-10-CM

## 2021-04-19 DIAGNOSIS — Z6831 Body mass index (BMI) 31.0-31.9, adult: Principal | ICD-10-CM

## 2021-04-19 DIAGNOSIS — R030 Elevated blood-pressure reading, without diagnosis of hypertension: Principal | ICD-10-CM

## 2021-04-19 DIAGNOSIS — J301 Allergic rhinitis due to pollen: Principal | ICD-10-CM

## 2021-04-19 DIAGNOSIS — Z130 Encounter for screening for diseases of the blood and blood-forming organs and certain disorders involving the immune mechanism: Principal | ICD-10-CM

## 2021-04-19 DIAGNOSIS — K760 Fatty (change of) liver, not elsewhere classified: Principal | ICD-10-CM

## 2021-04-19 DIAGNOSIS — F5081 Binge eating disorder: Principal | ICD-10-CM

## 2021-05-06 DIAGNOSIS — F5081 Binge eating disorder: Principal | ICD-10-CM

## 2021-05-07 MED ORDER — VYVANSE 10 MG CAPSULE
ORAL_CAPSULE | Freq: Every day | ORAL | 0 refills | 30.00000 days | Status: CP
Start: 2021-05-07 — End: 2021-06-06

## 2021-06-06 ENCOUNTER — Ambulatory Visit
Admit: 2021-06-06 | Discharge: 2021-06-07 | Payer: PRIVATE HEALTH INSURANCE | Attending: Family Medicine | Primary: Family Medicine

## 2021-06-06 DIAGNOSIS — R7303 Prediabetes: Principal | ICD-10-CM

## 2021-06-06 DIAGNOSIS — E6609 Other obesity due to excess calories: Principal | ICD-10-CM

## 2021-06-06 DIAGNOSIS — Z6831 Body mass index (BMI) 31.0-31.9, adult: Principal | ICD-10-CM

## 2021-06-06 MED ORDER — WEGOVY 1.7 MG/0.75 ML SUBCUTANEOUS PEN INJECTOR
SUBCUTANEOUS | 3 refills | 28 days | Status: CP
Start: 2021-06-06 — End: 2021-07-04

## 2021-06-11 DIAGNOSIS — F5081 Binge eating disorder: Principal | ICD-10-CM

## 2021-06-11 MED ORDER — VYVANSE 10 MG CAPSULE
ORAL_CAPSULE | Freq: Every day | ORAL | 0 refills | 30 days | Status: CP
Start: 2021-06-11 — End: 2021-07-11

## 2021-07-30 DIAGNOSIS — F5081 Binge eating disorder: Principal | ICD-10-CM

## 2021-07-30 MED ORDER — VYVANSE 10 MG CAPSULE
ORAL_CAPSULE | Freq: Every day | ORAL | 0 refills | 30.00000 days | Status: CP
Start: 2021-07-30 — End: 2021-08-29

## 2021-08-01 ENCOUNTER — Ambulatory Visit: Admit: 2021-08-01 | Discharge: 2021-08-02 | Payer: PRIVATE HEALTH INSURANCE

## 2021-08-01 DIAGNOSIS — F5081 Binge eating disorder: Principal | ICD-10-CM

## 2021-08-01 DIAGNOSIS — J029 Acute pharyngitis, unspecified: Principal | ICD-10-CM

## 2021-08-01 DIAGNOSIS — H9202 Otalgia, left ear: Principal | ICD-10-CM

## 2021-09-06 ENCOUNTER — Ambulatory Visit
Admit: 2021-09-06 | Discharge: 2021-09-07 | Payer: PRIVATE HEALTH INSURANCE | Attending: Family Medicine | Primary: Family Medicine

## 2021-09-06 DIAGNOSIS — Z6835 Body mass index (BMI) 35.0-35.9, adult: Principal | ICD-10-CM

## 2021-09-06 DIAGNOSIS — R7303 Prediabetes: Principal | ICD-10-CM

## 2021-09-06 DIAGNOSIS — E669 Obesity, unspecified: Principal | ICD-10-CM

## 2021-09-10 DIAGNOSIS — E282 Polycystic ovarian syndrome: Principal | ICD-10-CM

## 2021-09-10 MED ORDER — METFORMIN 500 MG TABLET
ORAL_TABLET | 3 refills | 0 days | Status: CP
Start: 2021-09-10 — End: ?

## 2021-10-10 MED ORDER — WEGOVY 0.5 MG/0.5 ML SUBCUTANEOUS PEN INJECTOR
SUBCUTANEOUS | 2 refills | 0 days | Status: CP
Start: 2021-10-10 — End: ?
  Filled 2021-10-15: qty 4, 28d supply, fill #0

## 2021-10-12 DIAGNOSIS — E669 Obesity, unspecified: Principal | ICD-10-CM

## 2021-10-12 DIAGNOSIS — Z6835 Body mass index (BMI) 35.0-35.9, adult: Principal | ICD-10-CM

## 2021-11-05 MED FILL — WEGOVY 0.5 MG/0.5 ML SUBCUTANEOUS PEN INJECTOR: SUBCUTANEOUS | 28 days supply | Qty: 4 | Fill #1

## 2021-11-30 ENCOUNTER — Telehealth: Admit: 2021-11-30 | Discharge: 2021-12-01 | Payer: PRIVATE HEALTH INSURANCE

## 2021-11-30 DIAGNOSIS — J069 Acute upper respiratory infection, unspecified: Principal | ICD-10-CM

## 2021-11-30 MED ORDER — AMOXICILLIN 875 MG-POTASSIUM CLAVULANATE 125 MG TABLET
ORAL_TABLET | Freq: Two times a day (BID) | ORAL | 0 refills | 7 days | Status: CP
Start: 2021-11-30 — End: 2021-12-07

## 2021-11-30 MED ORDER — PREDNISONE 20 MG TABLET
ORAL_TABLET | Freq: Every day | ORAL | 0 refills | 5 days | Status: CP
Start: 2021-11-30 — End: 2021-12-05

## 2021-12-26 ENCOUNTER — Ambulatory Visit
Admit: 2021-12-26 | Discharge: 2021-12-27 | Payer: PRIVATE HEALTH INSURANCE | Attending: Family Medicine | Primary: Family Medicine

## 2021-12-26 DIAGNOSIS — E669 Obesity, unspecified: Principal | ICD-10-CM

## 2021-12-26 DIAGNOSIS — R7303 Prediabetes: Principal | ICD-10-CM

## 2021-12-26 DIAGNOSIS — Z6835 Body mass index (BMI) 35.0-35.9, adult: Principal | ICD-10-CM

## 2021-12-26 MED ORDER — ZEPBOUND 2.5 MG/0.5 ML SUBCUTANEOUS PEN INJECTOR
SUBCUTANEOUS | 1 refills | 0 days | Status: CP
Start: 2021-12-26 — End: ?

## 2022-01-01 MED ORDER — WEGOVY 0.5 MG/0.5 ML SUBCUTANEOUS PEN INJECTOR
SUBCUTANEOUS | 2 refills | 28 days | Status: CP
Start: 2022-01-01 — End: ?

## 2022-04-24 ENCOUNTER — Ambulatory Visit: Admit: 2022-04-24 | Discharge: 2022-04-24 | Payer: PRIVATE HEALTH INSURANCE

## 2022-04-24 ENCOUNTER — Ambulatory Visit
Admit: 2022-04-24 | Discharge: 2022-04-24 | Payer: PRIVATE HEALTH INSURANCE | Attending: Family Medicine | Primary: Family Medicine

## 2022-04-24 DIAGNOSIS — F419 Anxiety disorder, unspecified: Principal | ICD-10-CM

## 2022-04-24 DIAGNOSIS — E785 Hyperlipidemia, unspecified: Principal | ICD-10-CM

## 2022-04-24 DIAGNOSIS — R7303 Prediabetes: Principal | ICD-10-CM

## 2022-04-24 DIAGNOSIS — Z6837 Body mass index (BMI) 37.0-37.9, adult: Principal | ICD-10-CM

## 2022-04-24 DIAGNOSIS — Z0000 Encounter for general adult medical examination without abnormal findings: Principal | ICD-10-CM

## 2022-04-24 DIAGNOSIS — E6609 Other obesity due to excess calories: Principal | ICD-10-CM

## 2022-04-24 DIAGNOSIS — F32A Depression, unspecified depression type: Principal | ICD-10-CM

## 2022-04-24 DIAGNOSIS — E559 Vitamin D deficiency, unspecified: Principal | ICD-10-CM

## 2022-04-24 DIAGNOSIS — Z130 Encounter for screening for diseases of the blood and blood-forming organs and certain disorders involving the immune mechanism: Principal | ICD-10-CM

## 2022-04-24 DIAGNOSIS — Z1329 Encounter for screening for other suspected endocrine disorder: Principal | ICD-10-CM

## 2022-04-24 MED ORDER — ZEPBOUND 5 MG/0.5 ML SUBCUTANEOUS PEN INJECTOR
SUBCUTANEOUS | 1 refills | 0 days | Status: CP
Start: 2022-04-24 — End: ?
  Filled 2022-06-04: qty 2, 28d supply, fill #0

## 2022-04-24 MED ORDER — ALPRAZOLAM 0.25 MG TABLET
ORAL_TABLET | Freq: Three times a day (TID) | ORAL | 0 refills | 10 days | Status: CP | PRN
Start: 2022-04-24 — End: ?

## 2022-04-30 DIAGNOSIS — R7989 Other specified abnormal findings of blood chemistry: Principal | ICD-10-CM

## 2022-05-14 DIAGNOSIS — Z6837 Body mass index (BMI) 37.0-37.9, adult: Principal | ICD-10-CM

## 2022-05-14 DIAGNOSIS — E6609 Other obesity due to excess calories: Principal | ICD-10-CM

## 2022-05-14 DIAGNOSIS — R7303 Prediabetes: Principal | ICD-10-CM

## 2022-05-21 DIAGNOSIS — E6609 Other obesity due to excess calories: Principal | ICD-10-CM

## 2022-06-06 ENCOUNTER — Ambulatory Visit: Admit: 2022-06-06 | Discharge: 2022-06-06 | Payer: PRIVATE HEALTH INSURANCE

## 2022-06-10 ENCOUNTER — Ambulatory Visit: Admit: 2022-06-10 | Discharge: 2022-06-11 | Payer: PRIVATE HEALTH INSURANCE | Attending: Family | Primary: Family

## 2022-06-10 ENCOUNTER — Ambulatory Visit
Admit: 2022-06-10 | Discharge: 2022-06-11 | Payer: PRIVATE HEALTH INSURANCE | Attending: Registered" | Primary: Registered"

## 2022-06-14 DIAGNOSIS — R9389 Abnormal findings on diagnostic imaging of other specified body structures: Principal | ICD-10-CM

## 2022-06-14 DIAGNOSIS — K769 Liver disease, unspecified: Principal | ICD-10-CM

## 2022-08-02 NOTE — Unmapped (Signed)
[facility] has reached out to this patient via MyChart to onboard them to our [facility] services for their Zepbound. Their medication is scheduled to be delivered on 08/05/22.They will now receive proactive outreach from the pharmacy team for refills.    Tracey Randall, PharmD  [facility] Pharmacist

## 2022-08-05 MED FILL — ZEPBOUND 5 MG/0.5 ML SUBCUTANEOUS PEN INJECTOR: SUBCUTANEOUS | 28 days supply | Qty: 2 | Fill #1

## 2022-08-05 NOTE — Unmapped (Addendum)
Hx of hemochromatosis. [place] 04/30/22 showed The liver was mildly enlarged and heterogenous in echotexture. Multiple hypoechoic lesions throughout the liver. Fib4: 0.9. Elevated LFTs. Reviewed relevant medications and labs. Patient is following our [facility]. See Obesity tab for medication changes.     Lab Results   Component Value Date    AST 78 (H) 04/24/2022    AST 21 04/19/2021    AST 18 01/19/2020    ALT 147 (H) 04/24/2022    ALT 24 04/19/2021    ALT 36 01/19/2020    PLT 280 04/24/2022    PLT 268 08/01/2021    PLT 324 04/19/2021     MASLD/NAFLD Screening:  Points <1.45: Cirrhosis less likely  FIB-4 Calculation: 0.9 at 04/24/2022  8:35 AM  Calculated from:  SGOT/AST: 78 U/L at 04/24/2022  8:35 AM  SGPT/ALT: 147 U/L at 04/24/2022  8:35 AM  Platelets: 280 10*9/L at 04/24/2022  8:35 AM  Age: 42 years

## 2022-08-05 NOTE — Unmapped (Signed)
Reviewed relevant medications and labs. Patient is following our [facility]. See Obesity tab for medication changes.    Lab Results   Component Value Date    CHOL 224 (H) 04/24/2022    CHOL 199 04/19/2021    CHOL 196 01/17/2021    LDL 127 (H) 04/24/2022    LDL 122 (H) 04/19/2021    LDL 125 (H) 01/17/2021    HDL 48 04/24/2022    HDL 51 04/19/2021    HDL 48 01/17/2021    TRIG 308 (H) 04/24/2022    TRIG 130 04/19/2021    TRIG 114 01/17/2021

## 2022-08-05 NOTE — Unmapped (Addendum)
[facility] Follow Up    Assessment/Plan:     Chief Complaint   Patient presents with    Follow-up       Problem List Items Addressed This Visit          Unprioritized    Class 2 obesity due to excess calories with body mass index (BMI) of 37.0 to 37.9 in adult     Tracey Randall is a 42 y.o. female with Class 2 obesity due to stress induced weight gain from bullying during high school. Patient's weight has been yoyo-ing 160-250 lb during her 20's.     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     Weight Summary:  Starting weight/BMI/WC: 250 lb, 5'7--BMI 39.1 (06/02/18, self reported), WC 44 (08/18/18, 1st IP visit)  Target weight/goal:   Today's weight/BMI: (!) 102.3 kg (225 lb 9.6 oz), BMI (!) 35.33 (08/08/2022)  % Body weight loss: 4.8%  Today's Waist Circumference: 44.5 inches (08/08/2022) <= 44 (03/14/20)    Referred by: Sharilyn Sites, MD in [facility] Medicine.    GOALS: 1) Continue intermittent fasting: Eat two meals a day and supplement with a protein shake or high protein snacks (greek yogurts, eggs, nuts, etc.)     I have reviewed the patient's medical history, lifestyle history and labs/tests.   My recommendations include the following:    Lifestyle Pattern Summary: Continue with healthy eating pattern and physical activity regimen! See GOALS above.     Medication: Patient is tolerating Zepbound 5 mg biweekly injections (paying out of pocket) and Metformin 500 mg BID with meals well with no s/e. Discussed s/e profile and decided to increase to Zepbound 7.5 mg weekly injections. Patient verbally agreed.     CONTINUE: Zepbound 5 mg --> 7.5 mg weekly injections, CONTINUE Metformin 500 mg BID with meals    Tried:  - Metformin: taking 500 mg BID for PCOS; no weight loss.  - Topamax: started on 06/02/18 at 250 lb. Stopped due to persistent tingling in fingers.  - Wegovy--previous good WL, difficulty with insurance & supply shortages  Contraindicated:  - phentermine/Wellbutrin: not good options due to elevated anxiety    Stress: Patient is working with a Paramedic on BED. Binge eating is anxiety driven and she is working on coping mechanisms. Referral sent for appetite awareness program. Patient will look into this program. (08/08/22).     Obesity Surgery: Discussed benefits of surgical treatment for obesity. Patient is nervous about this option d/t a cousin who had adverse outcomes. Referral sent 05/16/22 and patient established with Bariatric team. Patient decided not to pursue to option at this time. Will continue to follow (08/08/22)    MASLD/NAFLD Screening:  Points <1.45: Cirrhosis less likely  FIB-4 Calculation: 0.9 at 04/24/2022  8:35 AM  Calculated from:  SGOT/AST: 78 U/L at 04/24/2022  8:35 AM  SGPT/ALT: 147 U/L at 04/24/2022  8:35 AM  Platelets: 280 10*9/L at 04/24/2022  8:35 AM  Age: 85 years     Medical conditions:  Glaucoma: no  Seizures: no  Medullary thyroid cancer (personal or family history): no  Multiple Endocrine Neoplasia: no  Palpitations/Tachycardia: no  Chest Pain: no past history of MI.   Headaches/Migraines: no  Nephrolithiasis: no  H/o pancreatitis: no  GERD: no     Prior Surgeries:  Cholecystectomy: no  Hysterectomy: no    Birth Control Methods: OCP  Relevant Medications    tirzepatide (ZEPBOUND) 7.5 mg/0.5 mL injection pen    Prediabetes - Primary     Previous A1C of 5.4%. Moderate acanthosis nigricans on PE. Reviewed relevant medications and labs. Patient is following our Weight Management Clinic. See Obesity tab for medication changes.    Lab Results   Component Value Date    A1C 5.4 04/24/2022    A1C 4.7 (L) 04/19/2021    A1C 4.8 10/27/2020               NAFLD (nonalcoholic fatty liver disease)     Hx of hemochromatosis. [place] 04/30/22 showed The liver was mildly enlarged and heterogenous in echotexture. Multiple hypoechoic lesions throughout the liver. Fib4: 0.9. Elevated LFTs. Reviewed relevant medications and labs. Patient is following our Weight Management Clinic. See Obesity tab for medication changes.     Lab Results   Component Value Date    AST 78 (H) 04/24/2022    AST 21 04/19/2021    AST 18 01/19/2020    ALT 147 (H) 04/24/2022    ALT 24 04/19/2021    ALT 36 01/19/2020    PLT 280 04/24/2022    PLT 268 08/01/2021    PLT 324 04/19/2021     MASLD/NAFLD Screening:  Points <1.45: Cirrhosis less likely  FIB-4 Calculation: 0.9 at 04/24/2022  8:35 AM  Calculated from:  SGOT/AST: 78 U/L at 04/24/2022  8:35 AM  SGPT/ALT: 147 U/L at 04/24/2022  8:35 AM  Platelets: 280 10*9/L at 04/24/2022  8:35 AM  Age: 43 years            Dyslipidemia     Reviewed relevant medications and labs. Patient is following our Weight Management Clinic. See Obesity tab for medication changes.    Lab Results   Component Value Date    CHOL 224 (H) 04/24/2022    CHOL 199 04/19/2021    CHOL 196 01/17/2021    LDL 127 (H) 04/24/2022    LDL 122 (H) 04/19/2021    LDL 125 (H) 01/17/2021    HDL 48 04/24/2022    HDL 51 04/19/2021    HDL 48 01/17/2021    TRIG 161 (H) 04/24/2022    TRIG 130 04/19/2021    TRIG 114 01/17/2021                     Return in about 3 months (around 11/08/2022) for Weight clinic F/U one slot..    I have reviewed and addressed the patient???s adherence and response to prescribed medications. I have identified patient barriers to following the proposed medication and treatment plan, and have noted opportunities to optimize healthy behaviors. I have answered the patient???s questions to satisfaction and the patient voices understanding.    Time: Greater than 50% of this encounter was spent in direct consultation with the patient in evaluation and discussing all of the above. Duration of encounter: 30 minutes.     HPI:     Tracey Randall is a 42 y.o. female who  has a past medical history of Anxiety, Diabetes mellitus (CMS-HCC), GERD (gastroesophageal reflux disease), Mild intermittent asthma without complication (02/09/2014), and Obesity. who presents today for [facility] follow up.    Weight Management History  Wt Readings from Last 6 Encounters:   08/08/22 (!) 102.3 kg (225 lb 9.6 oz)   06/10/22 (!) 109.5 kg (241 lb 6.5 oz)   06/10/22 (!) 109.5 kg (241 lb 4.8 oz)   04/24/22 (!) 108 kg (  238 lb 3.2 oz)   04/24/22 (!) 107 kg (236 lb)   12/26/21 (!) 110 kg (242 lb 6.4 oz)         11/28/2020    10:29 AM 03/01/2021     9:00 AM 06/06/2021     2:23 PM 09/06/2021     2:26 PM 12/26/2021     9:15 AM 04/24/2022     9:16 AM 08/08/2022    10:30 AM   Waist Circumference   Waist Circumference 39.5 inches 49.9 inches 42 inches 43 inches 47.5 inches 46.5 inches 44.5 inches           Update: Patient has lost 9 lbs in 4 months.   Medication: Patient is tolerating Zepbound 5 mg biweekly injections (paying out of pocket) and Metformin 500 mg BID with meals well with no s/e.   Eating Pattern: Patient is eating 2-3 meals per day.   - Breakfast: eggs, oatmeal, toast, and fruit  - Lunch: salad, sandwich, soup, veggies, fruit, and leftovers  - Dinner: salad, soup, chicken, fish, pork, rice, and veggies  - Drinks: water, regular milk, and hot tea  - Snacks: fruit, granola bars, nuts, crackers/cheese, and popcorn  Physical Activity:  walking and running for 45 mins 5+x per week.   Barrier: none discussed    Lab Results   Component Value Date    LDL 127 (H) 04/24/2022    HDL 48 04/24/2022    A1C 5.4 04/24/2022    GLU 94 04/24/2022    TSH 1.373 04/24/2022    VITDTOTAL 33.2 04/24/2022    AST 78 (H) 04/24/2022    ALT 147 (H) 04/24/2022     Past Medical/Surgical History:     Past Medical History:   Diagnosis Date    Anxiety     Diabetes mellitus (CMS-HCC)     GERD (gastroesophageal reflux disease)     Mild intermittent asthma without complication 02/09/2014    Obesity      No past surgical history on file.    Social History:     Social History     Socioeconomic History    Marital status: Single     Spouse name: None    Number of children: 0 Years of education: None    Highest education level: None   Occupational History     Comment: Works in Education officer, environmental at [employer]   Tobacco Use    Smoking status: Former     Current packs/day: 0.00     Types: Cigarettes     Start date: 01/22/2000     Quit date: 01/21/2001     Years since quitting: 21.8     Passive exposure: Never    Smokeless tobacco: Never   Vaping Use    Vaping status: Never Used   Substance and Sexual Activity    Alcohol use: Yes     Comment: Intermittent but less than once a month    Drug use: No    Sexual activity: Not Currently   Social History Narrative    Is from [place], [place], moved to [place] at age 13.    She is single.     No children. She has PCOS. Has tried fertility treatments.     Lives with her mother and father and helps care for them.    She went to [employer] [facility].    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.     Social Determinants of Health  Financial Resource Strain: Low Risk  (04/24/2022)    Overall Financial Resource Strain (CARDIA)     Difficulty of Paying Living Expenses: Not hard at all   Food Insecurity: No Food Insecurity (04/24/2022)    Hunger Vital Sign     Worried About Running Out of Food in the Last Year: Never true     Ran Out of Food in the Last Year: Never true   Transportation Needs: No Transportation Needs (04/24/2022)    PRAPARE - Therapist, art (Medical): No     Lack of Transportation (Non-Medical): No   Physical Activity: Insufficiently Active (03/15/2020)    Exercise Vital Sign     Days of Exercise per Week: 3 days     Minutes of Exercise per Session: 30 min       Family History:     Family History   Problem Relation Age of Onset    Hypertension Mother     Heart disease Father     Stroke Father     Hypertension Father     Glaucoma Father     Hemochromatosis Paternal Aunt     Hemochromatosis Paternal Uncle     Mental illness Neg Hx     Substance Abuse Disorder Neg Hx        Allergies:     Alpha-gal (galactose-alpha-1,3-galactose); Klonopin [clonazepam]; Lexapro [escitalopram oxalate]; and Tramadol    Current Medications:     Current Outpatient Medications   Medication Sig Dispense Refill    ALPRAZolam (XANAX) 0.25 MG tablet Take 1 tablet (0.25 mg total) by mouth every eight (8) hours as needed for anxiety. 30 tablet 0    cetirizine-pseudoephedrine (ZYRTEC-D) 5-120 mg per tablet 1-2 daily as needed 60 tablet 2    cholecalciferol, vitamin D3, 2,000 unit Tab Take 1 tablet (50 mcg total) by mouth daily.      cyanocobalamin, vitamin B-12, 1000 MCG tablet Take 1 tablet (1,000 mcg total) by mouth daily.      diphenhydrAMINE (BENADRYL) 25 mg capsule/tablet Take 1 each (25 mg total) by mouth every six (6) hours as needed for itching.      famotidine (PEPCID) 10 MG tablet Take 1 tablet (10 mg total) by mouth in the morning.      magnesium oxide (MAG-OX) 400 mg tablet Take 1 tablet (400 mg total) by mouth daily.      multivitamin, prenatal, folic acid-iron, 27-1 mg Tab Take 1 tablet by mouth daily.      norgestrel-ethinyl estradiol (LOW-OGESTREL, 28,) 0.3-30 mg-mcg per tablet Take 1 tablet by mouth daily. 84 tablet 4    OMEGA-3 FATTY ACIDS/FISH OIL (OMEGA 3 FISH OIL ORAL) Take 1,200 mg by mouth daily.      vitamin E-268 mg, 400 UNIT, 268 mg (400 UNIT) capsule Take 1 capsule (268 mg total) by mouth daily.      albuterol HFA 90 mcg/actuation inhaler Inhale 2 puffs every six (6) hours as needed. 8 g 2    metFORMIN (GLUCOPHAGE) 500 MG tablet Take 1 tablet (500 mg total) by mouth two (2) times a day. 180 tablet 3    tirzepatide (ZEPBOUND) 7.5 mg/0.5 mL injection pen Inject 7.5 mg under the skin every seven (7) days. 2 mL 2     No current facility-administered medications for this visit.       I have reviewed and (if needed) updated the patient's problem list, medications, allergies, past medical and surgical history, social and family history.  ROS:     A 12 point review of systems was negative except for pertinent items noted in the HPI     Vital Signs:     Body mass index is 35.33 kg/m??. Waist Circumference: 44.5 inches    Wt Readings from Last 3 Encounters:   08/08/22 (!) 102.3 kg (225 lb 9.6 oz)   06/10/22 (!) 109.5 kg (241 lb 6.5 oz)   06/10/22 (!) 109.5 kg (241 lb 4.8 oz)     Temp Readings from Last 3 Encounters:   08/08/22 36.1 ??C (96.9 ??F) (Temporal)   04/24/22 36 ??C (96.8 ??F) (Temporal)   04/24/22 36.7 ??C (98.1 ??F) (Oral)     BP Readings from Last 3 Encounters:   08/08/22 122/87   06/10/22 144/82   04/24/22 139/91     Pulse Readings from Last 3 Encounters:   08/08/22 108   06/10/22 91   04/24/22 91         11/28/2020    10:29 AM 03/01/2021     9:00 AM 06/06/2021     2:23 PM 09/06/2021     2:26 PM 12/26/2021     9:15 AM 04/24/2022     9:16 AM 08/08/2022    10:30 AM   Waist Circumference   Waist Circumference 39.5 inches 49.9 inches 42 inches 43 inches 47.5 inches 46.5 inches 44.5 inches          Physical Exam:     General: well appearing, in NAD, Body mass index is 35.33 kg/m??. Ambulatory without help.  Body fat distribution: General adiposity. No supraclavicular adiposity. No dorsal adiposity. Waist Circumference: 44.5 inches     Head: normocephalic atraumatic.  Eyes: PERRLA, EOMI, Sclera WNL.   Neck: supple, no LAD, no thyromegaly.  CV: RRR no rubs or murmurs.  Lungs: clear bilaterally to auscultation. No wheezing.  Extremities: no clubbing, cyanosis, No edema.  Skin: no concerning lesions, rashes observed. No lipomas, moderate acanthosis nigricans.  Neuro: Alert and oriented X 3.    Labs:     No visits with results within 1 Month(s) from this visit.   Latest known visit with results is:   Office Visit on 04/24/2022   Component Date Value Ref Range Status    WBC 04/24/2022 7.8  3.6 - 11.2 10*9/L Final    RBC 04/24/2022 5.09  3.95 - 5.13 10*12/L Final    HGB 04/24/2022 15.3 (H)  11.3 - 14.9 g/dL Final    HCT 91/47/8295 44.2 (H)  34.0 - 44.0 % Final    MCV 04/24/2022 86.8  77.6 - 95.7 fL Final    MCH 04/24/2022 30.0  25.9 - 32.4 pg Final    MCHC 04/24/2022 34.5  32.0 - 36.0 g/dL Final    RDW 62/13/0865 12.8  12.2 - 15.2 % Final    MPV 04/24/2022 10.1  6.8 - 10.7 fL Final    Platelet 04/24/2022 280  150 - 450 10*9/L Final    Sodium 04/24/2022 135  135 - 145 mmol/L Final    Potassium 04/24/2022 3.7  3.4 - 4.8 mmol/L Final    Chloride 04/24/2022 102  98 - 107 mmol/L Final    CO2 04/24/2022 25.0  20.0 - 31.0 mmol/L Final    Anion Gap 04/24/2022 8  5 - 14 mmol/L Final    BUN 04/24/2022 10  9 - 23 mg/dL Final    Creatinine 78/46/9629 0.46 (L)  0.55 - 1.02 mg/dL Final    BUN/Creatinine Ratio 04/24/2022 22   Final  eGFR CKD-EPI (2021) Female 04/24/2022 >90  >=60 mL/min/1.74m2 Final    eGFR calculated with CKD-EPI 2021 equation in accordance with [employer] and [employer] of Nephrology Task Force recommendations.    Glucose 04/24/2022 94  70 - 99 mg/dL Final    Calcium 16/10/9602 9.5  8.7 - 10.4 mg/dL Final    Albumin 54/09/8117 4.1  3.4 - 5.0 g/dL Final    Total Protein 04/24/2022 7.6  5.7 - 8.2 g/dL Final    Total Bilirubin 04/24/2022 0.9  0.3 - 1.2 mg/dL Final    AST 14/78/2956 78 (H)  <=34 U/L Final    ALT 04/24/2022 147 (H)  10 - 49 U/L Final    Alkaline Phosphatase 04/24/2022 74  46 - 116 U/L Final    Hemoglobin A1C 04/24/2022 5.4  4.8 - 5.6 % Final    Estimated Average Glucose 04/24/2022 108  mg/dL Final    Triglycerides 04/24/2022 243 (H)  0 - 150 mg/dL Final    Cholesterol 21/30/8657 224 (H)  <=200 mg/dL Final    HDL 84/69/6295 48  40 - 60 mg/dL Final    LDL Calculated 04/24/2022 284 (H)  40 - 99 mg/dL Final    NHLBI Recommended Ranges, LDL Cholesterol, for Adults (20+yrs) (ATPIII), mg/dL  Optimal              <132  Near Optimal        100-129  Borderline High     130-159  High                160-189  Very High            >=190  NHLBI Recommended Ranges, LDL Cholesterol, for Children (2-19 yrs), mg/dL  Desirable            <440  Borderline High     110-129  High                 >=130      VLDL Cholesterol Cal 04/24/2022 48.6 (H)  8 - 32 mg/dL Final    Chol/HDL Ratio 04/24/2022 4.7 (H)  1.0 - 4.5 Final    Non-HDL Cholesterol 04/24/2022 176 (H)  70 - 130 mg/dL Final    Non-HDL Cholesterol Recommended Ranges (mg/dL)  Optimal       <102  Near Optimal 130 - 159  Borderline High 160 - 189  High             190 - 219  Very High       >220      FASTING 04/24/2022 Yes   Final    TSH 04/24/2022 1.373  0.550 - 4.780 uIU/mL Final    Vitamin D Total (25OH) 04/24/2022 33.2  20.0 - 80.0 ng/mL Final       Follow-up:     Return in about 3 months (around 11/08/2022) for Weight clinic F/U one slot..    I attest that I, Donzetta Matters, personally documented this note for [facility], MD.      Donzetta Matters  08/08/2022     I attest that I have reviewed the note and that the components of the history of the present illness, the physical exam, and the assessment and plan documented were performed by me or were performed in my presence where I verified the documentation and performed (or re-performed) the exam and medical decision making.     Marshell Garfinkel, MD

## 2022-08-05 NOTE — Unmapped (Addendum)
Tracey Randall is a 42 y.o. female with Class 2 obesity due to stress induced weight gain from bullying during high school. Patient's weight has been Tracey-ing 160-250 lb during her 20's.     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     Weight Summary:  Starting weight/BMI/WC: 250 lb, 5'7--BMI 39.1 (06/02/18, self reported), WC 44 (08/18/18, 1st IP visit)  Target weight/goal:   Today's weight/BMI: (!) 102.3 kg (225 lb 9.6 oz), BMI (!) 35.33 (08/08/2022)  % Body weight loss: 4.8%  Today's Waist Circumference: 44.5 inches (08/08/2022) <= 44 (03/14/20)    Referred by: Sharilyn Sites, MD in [facility] Medicine.    GOALS: 1) Continue intermittent fasting: Eat two meals a day and supplement with a protein shake or high protein snacks (greek yogurts, eggs, nuts, etc.)     I have reviewed the patient's medical history, lifestyle history and labs/tests.   My recommendations include the following:    Lifestyle Pattern Summary: Continue with healthy eating pattern and physical activity regimen! See GOALS above.     Medication: Patient is tolerating Zepbound 5 mg biweekly injections (paying out of pocket) and Metformin 500 mg BID with meals well with no s/e. Discussed s/e profile and decided to increase to Zepbound 7.5 mg weekly injections. Patient verbally agreed.     CONTINUE: Zepbound 5 mg --> 7.5 mg weekly injections, CONTINUE Metformin 500 mg BID with meals    Tried:  - Metformin: taking 500 mg BID for PCOS; no weight loss.  - Topamax: started on 06/02/18 at 250 lb. Stopped due to persistent tingling in fingers.  - Wegovy--previous good WL, difficulty with insurance & supply shortages  Contraindicated:  - phentermine/Wellbutrin: not good options due to elevated anxiety    Stress: Patient is working with a Paramedic on BED. Binge eating is anxiety driven and she is working on coping mechanisms. Referral sent for appetite awareness program. Patient will look into this program. (08/08/22).     Obesity Surgery: Discussed benefits of surgical treatment for obesity. Patient is nervous about this option d/t a cousin who had adverse outcomes. Referral sent 05/16/22 and patient established with Bariatric team. Patient decided not to pursue to option at this time. Will continue to follow (08/08/22)    MASLD/NAFLD Screening:  Points <1.45: Cirrhosis less likely  FIB-4 Calculation: 0.9 at 04/24/2022  8:35 AM  Calculated from:  SGOT/AST: 78 U/L at 04/24/2022  8:35 AM  SGPT/ALT: 147 U/L at 04/24/2022  8:35 AM  Platelets: 280 10*9/L at 04/24/2022  8:35 AM  Age: 52 years     Medical conditions:  Glaucoma: no  Seizures: no  Medullary thyroid cancer (personal or family history): no  Multiple Endocrine Neoplasia: no  Palpitations/Tachycardia: no  Chest Pain: no past history of MI.   Headaches/Migraines: no  Nephrolithiasis: no  H/o pancreatitis: no  GERD: no     Prior Surgeries:  Cholecystectomy: no  Hysterectomy: no    Birth Control Methods: OCP

## 2022-08-05 NOTE — Unmapped (Addendum)
Previous A1C of 5.4%. Moderate acanthosis nigricans on PE. Reviewed relevant medications and labs. Patient is following our Weight Management Clinic. See Obesity tab for medication changes.    Lab Results   Component Value Date    A1C 5.4 04/24/2022    A1C 4.7 (L) 04/19/2021    A1C 4.8 10/27/2020

## 2022-08-08 ENCOUNTER — Ambulatory Visit
Admit: 2022-08-08 | Discharge: 2022-08-09 | Payer: PRIVATE HEALTH INSURANCE | Attending: Family Medicine | Primary: Family Medicine

## 2022-08-08 DIAGNOSIS — E785 Hyperlipidemia, unspecified: Principal | ICD-10-CM

## 2022-08-08 DIAGNOSIS — E6609 Other obesity due to excess calories: Principal | ICD-10-CM

## 2022-08-08 DIAGNOSIS — K760 Fatty (change of) liver, not elsewhere classified: Principal | ICD-10-CM

## 2022-08-08 DIAGNOSIS — R7303 Prediabetes: Principal | ICD-10-CM

## 2022-08-08 DIAGNOSIS — Z6837 Body mass index (BMI) 37.0-37.9, adult: Principal | ICD-10-CM

## 2022-08-08 MED ORDER — ZEPBOUND 7.5 MG/0.5 ML SUBCUTANEOUS PEN INJECTOR
SUBCUTANEOUS | 2 refills | 0 days | Status: CP
Start: 2022-08-08 — End: ?
  Filled 2022-10-25: qty 2, 28d supply, fill #0

## 2022-10-24 DIAGNOSIS — E66812 Class 2 obesity due to excess calories with body mass index (BMI) of 37.0 to 37.9 in adult, unspecified whether serious comorbidity present: Principal | ICD-10-CM

## 2022-10-24 DIAGNOSIS — E6609 Other obesity due to excess calories: Principal | ICD-10-CM

## 2022-10-24 DIAGNOSIS — Z6837 Body mass index (BMI) 37.0-37.9, adult: Principal | ICD-10-CM

## 2022-10-28 DIAGNOSIS — E282 Polycystic ovarian syndrome: Principal | ICD-10-CM

## 2022-10-28 MED ORDER — METFORMIN 500 MG TABLET
ORAL_TABLET | Freq: Two times a day (BID) | ORAL | 3 refills | 90 days | Status: CP
Start: 2022-10-28 — End: ?

## 2022-10-28 NOTE — Unmapped (Signed)
Patient is requesting the following refill  Requested Prescriptions     Pending Prescriptions Disp Refills    metFORMIN (GLUCOPHAGE) 500 MG tablet 180 tablet 3     Sig: Take 1 tablet (500 mg total) by mouth two (2) times a day.       Recent Visits  Date Type Provider Dept   04/24/22 Office Visit Coralee North, Magnus Ivan, FNP [facility] [address] [facility]   Showing recent visits within past 365 days and meeting all other requirements  Future Appointments  Date Type Provider Dept   04/25/23 Appointment Coralee North, Magnus Ivan, FNP [facility] [address] [facility]   Showing future appointments within next 365 days and meeting all other requirements       Labs: A1c:   Hemoglobin A1C (%)   Date Value   04/24/2022 5.4   11/02/2019 5.3

## 2022-11-22 NOTE — Unmapped (Addendum)
History of hemochromatosis. [place] 04/30/22 showed The liver was mildly enlarged and heterogenous in echotexture. Multiple hypoechoic lesions throughout the liver. Fib4: 0.9. Elevated LFTs. Patient was previously followed by hepatology in 2018. Repeat LFTs and acute hepatitis panel ordered (12/04/22). Patient has a pending MRI. If persistent elevated LFT, patient consents to a referral to [facility] hepatology. Reviewed relevant medications and labs. Patient is following our Weight Management Clinic. See Obesity tab for medication changes.     Lab Results   Component Value Date    AST 78 (H) 04/24/2022    AST 21 04/19/2021    AST 18 01/19/2020    ALT 147 (H) 04/24/2022    ALT 24 04/19/2021    ALT 36 01/19/2020    PLT 280 04/24/2022    PLT 268 08/01/2021    PLT 324 04/19/2021     MASLD/NAFLD Screening:  Points <1.45: Cirrhosis less likely  FIB-4 Calculation: 0.9 at 04/24/2022  8:35 AM  Calculated from:  SGOT/AST: 78 U/L at 04/24/2022  8:35 AM  SGPT/ALT: 147 U/L at 04/24/2022  8:35 AM  Platelets: 280 10*9/L at 04/24/2022  8:35 AM  Age: 42 years

## 2022-11-22 NOTE — Unmapped (Signed)
Previous A1C of 5.4%. Moderate acanthosis nigricans on PE. Reviewed relevant medications and labs. Patient is following our Weight Management Clinic. See Obesity tab for medication changes.    Lab Results   Component Value Date    A1C 5.4 04/24/2022    A1C 4.7 (L) 04/19/2021    A1C 4.8 10/27/2020

## 2022-11-22 NOTE — Unmapped (Signed)
Tracey Randall is a 42 y.o. female with Class 2 obesity due to stress induced weight gain from bullying during high school. Patient's weight has been yoyo-ing 160-250 lb during her 20's.     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     Weight Summary:  Starting weight/BMI/WC: 250 lb, 5'7--BMI 39.1 (06/02/18, self reported), WC 44 (08/18/18, 1st IP visit)  Target weight/goal:   Today's weight/BMI:  , BMI   (12/04/2022)  % Body weight loss: 4.8%  Today's   (12/04/2022) <= 44 (03/14/20)    Referred by: Sharilyn Sites, MD in [facility].    GOALS:    I have reviewed the patient's medical history, lifestyle history and labs/tests.   My recommendations include the following:    Lifestyle Pattern Summary: See GOALS above.     Medication:     CONTINUE: Zepbound 7.5 mg weekly injections, CONTINUE Metformin 500 mg BID with meals    Tried:  - Metformin: taking 500 mg BID for PCOS; no weight loss.  - Topamax: started on 06/02/18 at 250 lb. Stopped due to persistent tingling in fingers.  - Wegovy--previous good WL, difficulty with insurance & supply shortages  Contraindicated:  - phentermine/Wellbutrin: not good options due to elevated anxiety    Stress: Patient is working with a Paramedic on BED. Binge eating is anxiety driven and she is working on coping mechanisms. Referral sent for appetite awareness program. Patient will look into this program. (11/22/22).     Obesity Surgery: Discussed benefits of surgical treatment for obesity. Patient is nervous about this option d/t a cousin who had adverse outcomes. Referral sent 05/16/22 and patient established with Bariatric team. Patient decided not to pursue to option at this time. Will continue to follow (11/22/22)    MASLD/NAFLD Screening:  Points <1.45: Cirrhosis less likely  FIB-4 Calculation: 0.9 at 04/24/2022  8:35 AM  Calculated from:  SGOT/AST: 78 U/L at 04/24/2022  8:35 AM  SGPT/ALT: 147 U/L at 04/24/2022  8:35 AM  Platelets: 280 10*9/L at 04/24/2022  8:35 AM  Age: 68 years     Medical conditions:  Glaucoma: no  Seizures: no  Medullary thyroid cancer (personal or family history): no  Multiple Endocrine Neoplasia: no  Palpitations/Tachycardia: no  Chest Pain: no past history of MI.   Headaches/Migraines: no  Nephrolithiasis: no  H/o pancreatitis: no  GERD: no     Prior Surgeries:  Cholecystectomy: no  Hysterectomy: no    Birth Control Methods: OCP

## 2022-11-22 NOTE — Unmapped (Signed)
 [facility] Follow Up    Assessment/Plan:     Chief Complaint   Patient presents with    Weight Management       Problem List Items Addressed This Visit          Unprioritized    Class 2 obesity due to excess calories with body mass index (BMI) of 37.0 to 37.9 in adult     Tracey Randall is a 42 y.o. female with Class 2 obesity due to stress induced weight gain from bullying during high school. Patient's weight has been yoyo-ing 160-250 lb during her 20's.     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     Weight Summary:  Starting weight/BMI/WC: 250 lb, 5'7--BMI 39.1 (06/02/18, self reported), WC 44 (08/18/18, 1st IP visit)  Target weight/goal:   Today's weight/BMI: 96.6 kg (213 lb), BMI (!) 33.35 (12/04/2022)  % Body weight loss: 4.8%  Today's Waist Circumference: 41 inches (12/04/2022) <= 44 (03/14/20)    Referred by: Sharilyn Sites, MD in [facility].    GOALS: 1) Aim for 80 grams of protein per day. 2) Continue with healthy eating pattern and physical activity regimen!     I have reviewed the patient's medical history, lifestyle history and labs/tests.   My recommendations include the following:    Lifestyle Pattern Summary: See GOALS above.     Medication: Patient is taking medication as directed. Patient is tolerating Zepbound 7.5 mg biweekly injections (out of pocket) and Metformin 500 mg BID with meals well with no s/e. Discussed s/e profile and decided to continue with current medication regimen. Patient verbally agreed.     CONTINUE: Zepbound 7.5 mg weekly injections, CONTINUE Metformin 500 mg BID with meals    Tried:  - Metformin: taking 500 mg BID for PCOS; no weight loss.  - Topamax: started on 06/02/18 at 250 lb. Stopped due to persistent tingling in fingers.  - Wegovy--previous good WL, difficulty with insurance & supply shortages  Contraindicated:  - phentermine/Wellbutrin: not good options due to elevated anxiety    Stress: Patient is working with a Paramedic on BED. Binge eating is anxiety driven and she is working on coping mechanisms. Referral sent for appetite awareness program. Patient will look into this program. (12/04/22).     Obesity Surgery: Discussed benefits of surgical treatment for obesity. Patient is nervous about this option d/t a cousin who had adverse outcomes. Referral sent 05/16/22 and patient established with Bariatric team. Patient decided not to pursue to option at this time. Will continue to follow (12/04/22)    MASLD/NAFLD Screening:  Points <1.45: Cirrhosis less likely  FIB-4 Calculation: 0.9 at 04/24/2022  8:35 AM  Calculated from:  SGOT/AST: 78 U/L at 04/24/2022  8:35 AM  SGPT/ALT: 147 U/L at 04/24/2022  8:35 AM  Platelets: 280 10*9/L at 04/24/2022  8:35 AM  Age: 58 years     Medical conditions:  Glaucoma: no  Seizures: no  Medullary thyroid cancer (personal or family history): no  Multiple Endocrine Neoplasia: no  Palpitations/Tachycardia: no  Chest Pain: no past history of MI.   Headaches/Migraines: no  Nephrolithiasis: no  H/o pancreatitis: no  GERD: no     Prior Surgeries:  Cholecystectomy: no  Hysterectomy: no    Birth Control Methods: OCP         Relevant Medications    tirzepatide (ZEPBOUND) 7.5 mg/0.5 mL injection  pen    Prediabetes - Primary     Previous A1C of 5.4%. Moderate acanthosis nigricans on PE. Reviewed relevant medications and labs. Patient is following our Weight Management Clinic. See Obesity tab for medication changes.    Lab Results   Component Value Date    A1C 5.4 04/24/2022    A1C 4.7 (L) 04/19/2021    A1C 4.8 10/27/2020               NAFLD (nonalcoholic fatty liver disease)     History of hemochromatosis. [place] 04/30/22 showed The liver was mildly enlarged and heterogenous in echotexture. Multiple hypoechoic lesions throughout the liver. Fib4: 0.9. Elevated LFTs. Patient was previously followed by hepatology in 2018. Repeat LFTs and acute hepatitis panel ordered (12/04/22). Patient has a pending MRI. If persistent elevated LFT, patient consents to a referral to [facility] hepatology. Reviewed relevant medications and labs. Patient is following our Weight Management Clinic. See Obesity tab for medication changes.     Lab Results   Component Value Date    AST 78 (H) 04/24/2022    AST 21 04/19/2021    AST 18 01/19/2020    ALT 147 (H) 04/24/2022    ALT 24 04/19/2021    ALT 36 01/19/2020    PLT 280 04/24/2022    PLT 268 08/01/2021    PLT 324 04/19/2021     MASLD/NAFLD Screening:  Points <1.45: Cirrhosis less likely  FIB-4 Calculation: 0.9 at 04/24/2022  8:35 AM  Calculated from:  SGOT/AST: 78 U/L at 04/24/2022  8:35 AM  SGPT/ALT: 147 U/L at 04/24/2022  8:35 AM  Platelets: 280 10*9/L at 04/24/2022  8:35 AM  Age: 60 years            Relevant Orders    Comprehensive Metabolic Panel (Completed)    Hepatitis Panel, Acute        Return in about 4 months (around 04/03/2023) for Weight clinic F/U one slot..    I have reviewed and addressed the patient???s adherence and response to prescribed medications. I have identified patient barriers to following the proposed medication and treatment plan, and have noted opportunities to optimize healthy behaviors. I have answered the patient???s questions to satisfaction and the patient voices understanding.    Time: Greater than 50% of this encounter was spent in direct consultation with the patient in evaluation and discussing all of the above. Duration of encounter: 20 minutes.     HPI:     Tracey Randall is a 42 y.o. female who  has a past medical history of Anxiety, Diabetes mellitus (CMS-HCC), GERD (gastroesophageal reflux disease), Mild intermittent asthma without complication (02/09/2014), and Obesity. who presents today for [facility] follow up.    Weight Management History  Wt Readings from Last 6 Encounters:   12/04/22 96.6 kg (213 lb)   08/08/22 (!) 102.3 kg (225 lb 9.6 oz)   06/10/22 (!) 109.5 kg (241 lb 6.5 oz)   06/10/22 (!) 109.5 kg (241 lb 4.8 oz)   04/24/22 (!) 108 kg (238 lb 3.2 oz)   04/24/22 (!) 107 kg (236 lb)         03/01/2021     9:00 AM 06/06/2021     2:23 PM 09/06/2021     2:26 PM 12/26/2021     9:15 AM 04/24/2022     9:16 AM 08/08/2022    10:30 AM 12/04/2022     3:10 PM   Waist Circumference   Waist Circumference 49.9 inches 42  inches 43 inches 47.5 inches 46.5 inches 44.5 inches 41 inches           Update: Patient has lost 12 lbs in 4 months.   Medication: Patient is taking medication as directed. Patient is tolerating Zepbound 7.5 mg biweekly injections (out of pocket) and Metformin 500 mg BID with meals well with no s/e.   Eating Pattern:  - Fast food/Restaurants/Takeout: 0-1x per week.   - Breakfast: eggs, oatmeal, and fruit  - Lunch: soup, fruit, and leftovers  - Dinner: sandwich, soup, chicken, fish, pork, rice, and veggies  - Drinks: water, almond milk, and hot tea  - Snacks: fruit, nuts, and popcorn  Physical Activity:  Walking, Running, and Lifting weights for 45 mins 5-7x per week.   Barrier: Financial barriers    Lab Results   Component Value Date    LDL 127 (H) 04/24/2022    HDL 48 04/24/2022    A1C 5.4 04/24/2022    GLU 101 12/04/2022    TSH 1.373 04/24/2022    VITDTOTAL 33.2 04/24/2022    AST 21 12/04/2022    ALT 42 12/04/2022     Past Medical/Surgical History:     Past Medical History:   Diagnosis Date    Anxiety     Diabetes mellitus (CMS-HCC)     GERD (gastroesophageal reflux disease)     Mild intermittent asthma without complication 02/09/2014    Obesity      No past surgical history on file.    Social History:     Social History     Socioeconomic History    Marital status: Single     Spouse name: None    Number of children: 0    Years of education: None    Highest education level: None   Occupational History     Comment: Works in Education officer, environmental at [employer]   Tobacco Use    Smoking status: Former     Current packs/day: 0.00     Types: Cigarettes     Start date: 01/22/2000     Quit date: 01/21/2001     Years since quitting: 21.8     Passive exposure: Never    Smokeless tobacco: Never   Vaping Use    Vaping status: Never Used   Substance and Sexual Activity    Alcohol use: Yes     Comment: Intermittent but less than once a month    Drug use: No    Sexual activity: Not Currently   Social History Narrative    Is from [place], [place], moved to [place] at age 41.    She is single.     No children. She has PCOS. Has tried fertility treatments.     Lives with her mother and father and helps care for them.    She went to [employer].    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.     Social Determinants of Health     Financial Resource Strain: Low Risk  (04/24/2022)    Overall Financial Resource Strain (CARDIA)     Difficulty of Paying Living Expenses: Not hard at all   Food Insecurity: No Food Insecurity (04/24/2022)    Hunger Vital Sign     Worried About Running Out of Food in the Last Year: Never true     Ran Out of Food in the Last Year: Never true   Transportation Needs: No Transportation Needs (04/24/2022)    PRAPARE -  Therapist, art (Medical): No     Lack of Transportation (Non-Medical): No   Physical Activity: Insufficiently Active (03/15/2020)    Exercise Vital Sign     Days of Exercise per Week: 3 days     Minutes of Exercise per Session: 30 min       Family History:     Family History   Problem Relation Age of Onset    Hypertension Mother     Heart disease Father     Stroke Father     Hypertension Father     Glaucoma Father     Hemochromatosis Paternal Aunt     Hemochromatosis Paternal Uncle     Mental illness Neg Hx     Substance Abuse Disorder Neg Hx        Allergies:     Alpha-gal (galactose-alpha-1,3-galactose); Klonopin [clonazepam]; Lexapro [escitalopram oxalate]; and Tramadol    Current Medications:     Current Outpatient Medications   Medication Sig Dispense Refill    ALPRAZolam (XANAX) 0.25 MG tablet Take 1 tablet (0.25 mg total) by mouth every eight (8) hours as needed for anxiety. 30 tablet 0    cetirizine-pseudoephedrine (ZYRTEC-D) 5-120 mg per tablet 1-2 daily as needed 60 tablet 2    cholecalciferol, vitamin D3, 2,000 unit Tab Take 1 tablet (50 mcg total) by mouth daily.      cyanocobalamin, vitamin B-12, 1000 MCG tablet Take 1 tablet (1,000 mcg total) by mouth daily.      diphenhydrAMINE (BENADRYL) 25 mg capsule/tablet Take 1 each (25 mg total) by mouth every six (6) hours as needed for itching.      famotidine (PEPCID) 10 MG tablet Take 1 tablet (10 mg total) by mouth in the morning.      magnesium oxide (MAG-OX) 400 mg tablet Take 1 tablet (400 mg total) by mouth daily.      metFORMIN (GLUCOPHAGE) 500 MG tablet Take 1 tablet (500 mg total) by mouth two (2) times a day. 180 tablet 3    multivitamin, prenatal, folic acid-iron, 27-1 mg Tab Take 1 tablet by mouth daily.      norgestrel-ethinyl estradiol (LOW-OGESTREL, 28,) 0.3-30 mg-mcg per tablet Take 1 tablet by mouth daily. 84 tablet 4    OMEGA-3 FATTY ACIDS/FISH OIL (OMEGA 3 FISH OIL ORAL) Take 1,200 mg by mouth daily.      vitamin E-268 mg, 400 UNIT, 268 mg (400 UNIT) capsule Take 1 capsule (268 mg total) by mouth daily.      albuterol HFA 90 mcg/actuation inhaler Inhale 2 puffs every six (6) hours as needed. 8 g 2    tirzepatide (ZEPBOUND) 7.5 mg/0.5 mL injection pen Inject 7.5 mg under the skin every seven (7) days. 2 mL 2     No current facility-administered medications for this visit.       I have reviewed and (if needed) updated the patient's problem list, medications, allergies, past medical and surgical history, social and family history.    ROS:     A 12 point review of systems was negative except for pertinent items noted in the HPI     Vital Signs:     Body mass index is 33.35 kg/m??. Waist Circumference: 41 inches    Wt Readings from Last 3 Encounters:   12/04/22 96.6 kg (213 lb)   08/08/22 (!) 102.3 kg (225 lb 9.6 oz)   06/10/22 (!) 109.5 kg (241 lb 6.5 oz) Temp Readings from Last 3 Encounters:  08/08/22 36.1 ??C (96.9 ??F) (Temporal)   04/24/22 36 ??C (96.8 ??F) (Temporal)   04/24/22 36.7 ??C (98.1 ??F) (Oral)     BP Readings from Last 3 Encounters:   12/04/22 133/88   08/08/22 122/87   06/10/22 144/82     Pulse Readings from Last 3 Encounters:   12/04/22 98   08/08/22 108   06/10/22 91        Physical Exam:     General: well appearing, in NAD, Body mass index is 33.35 kg/m??. Ambulatory without help.  Body fat distribution: General adiposity. No supraclavicular adiposity. No dorsal adiposity. Waist Circumference: 41 inches     Head: normocephalic atraumatic.  Eyes: PERRLA, EOMI, Sclera WNL.   Neck: supple, no LAD, no thyromegaly.  CV: RRR no rubs or murmurs.  Lungs: clear bilaterally to auscultation. No wheezing.  Extremities: no clubbing, cyanosis, No edema.  Skin: no concerning lesions, rashes observed. No lipomas, moderate acanthosis nigricans.  Neuro: Alert and oriented X 3.    Labs:     Office Visit on 12/04/2022   Component Date Value Ref Range Status    Sodium 12/04/2022 142  135 - 145 mmol/L Final    Potassium 12/04/2022 3.8  3.4 - 4.8 mmol/L Final    Chloride 12/04/2022 106  98 - 107 mmol/L Final    CO2 12/04/2022 28.4  20.0 - 31.0 mmol/L Final    Anion Gap 12/04/2022 8  5 - 14 mmol/L Final    BUN 12/04/2022 15  9 - 23 mg/dL Final    Creatinine 16/10/9602 0.70  0.55 - 1.02 mg/dL Final    BUN/Creatinine Ratio 12/04/2022 21   Final    eGFR CKD-EPI (2021) Female 12/04/2022 >90  >=60 mL/min/1.54m2 Final    eGFR calculated with CKD-EPI 2021 equation in accordance with [employer] and [employer] of Nephrology Task Force recommendations.    Glucose 12/04/2022 101  70 - 179 mg/dL Final    Calcium 54/09/8117 9.9  8.7 - 10.4 mg/dL Final    Albumin 14/78/2956 3.9  3.4 - 5.0 g/dL Final    Total Protein 12/04/2022 7.7  5.7 - 8.2 g/dL Final    Total Bilirubin 12/04/2022 0.7  0.3 - 1.2 mg/dL Final    AST 21/30/8657 21  <=34 U/L Final    ALT 12/04/2022 42 10 - 49 U/L Final    Alkaline Phosphatase 12/04/2022 85  46 - 116 U/L Final       Follow-up:     Return in about 4 months (around 04/03/2023) for Weight clinic F/U one slot..    I attest that I, Donzetta Matters, personally documented this note for [facility], MD.      Donzetta Matters  12/04/2022     I attest that I have reviewed the note and that the components of the history of the present illness, the physical exam, and the assessment and plan documented were performed by me or were performed in my presence where I verified the documentation and performed (or re-performed) the exam and medical decision making.     Marshell Garfinkel, MD

## 2022-12-04 ENCOUNTER — Ambulatory Visit
Admit: 2022-12-04 | Discharge: 2022-12-05 | Payer: PRIVATE HEALTH INSURANCE | Attending: Family Medicine | Primary: Family Medicine

## 2022-12-04 DIAGNOSIS — Z6837 Body mass index (BMI) 37.0-37.9, adult: Principal | ICD-10-CM

## 2022-12-04 DIAGNOSIS — K760 Fatty (change of) liver, not elsewhere classified: Principal | ICD-10-CM

## 2022-12-04 DIAGNOSIS — E66812 Class 2 obesity due to excess calories with body mass index (BMI) of 37.0 to 37.9 in adult, unspecified whether serious comorbidity present: Principal | ICD-10-CM

## 2022-12-04 DIAGNOSIS — E6609 Other obesity due to excess calories: Principal | ICD-10-CM

## 2022-12-04 DIAGNOSIS — R7303 Prediabetes: Principal | ICD-10-CM

## 2022-12-04 LAB — COMPREHENSIVE METABOLIC PANEL
ALBUMIN: 3.9 g/dL (ref 3.4–5.0)
ALKALINE PHOSPHATASE: 85 U/L (ref 46–116)
ALT (SGPT): 42 U/L (ref 10–49)
ANION GAP: 8 mmol/L (ref 5–14)
AST (SGOT): 21 U/L (ref <=34)
BILIRUBIN TOTAL: 0.7 mg/dL (ref 0.3–1.2)
BLOOD UREA NITROGEN: 15 mg/dL (ref 9–23)
BUN / CREAT RATIO: 21
CALCIUM: 9.9 mg/dL (ref 8.7–10.4)
CHLORIDE: 106 mmol/L (ref 98–107)
CO2: 28.4 mmol/L (ref 20.0–31.0)
CREATININE: 0.7 mg/dL
EGFR CKD-EPI (2021) FEMALE: >90 mL/min/{1.73_m2} (ref >=60)
GLUCOSE RANDOM: 101 mg/dL (ref 70–179)
POTASSIUM: 3.8 mmol/L (ref 3.4–4.8)
PROTEIN TOTAL: 7.7 g/dL (ref 5.7–8.2)
SODIUM: 142 mmol/L (ref 135–145)

## 2022-12-04 MED ORDER — ZEPBOUND 7.5 MG/0.5 ML SUBCUTANEOUS PEN INJECTOR
SUBCUTANEOUS | 4 refills | 28 days | Status: CP
Start: 2022-12-04 — End: ?
  Filled 2022-12-26: qty 2, 28d supply, fill #0

## 2022-12-06 LAB — HEPATITIS PANEL, ACUTE
HEPATITIS A IGM ANTIBODY: NONREACTIVE
HEPATITIS B CORE IGM ANTIBODY: NONREACTIVE
HEPATITIS B SURFACE ANTIGEN: NONREACTIVE
HEPATITIS C ANTIBODY: NONREACTIVE

## 2022-12-26 NOTE — Unmapped (Signed)
Tracey Randall requested a refill of their Zepbound via IVR/Web. [facility] has scheduled delivery per the patients request via Same Day Courier to be delivered to their prescription address on 12/26/22.

## 2023-01-03 ENCOUNTER — Ambulatory Visit: Admit: 2023-01-03 | Discharge: 2023-01-04 | Payer: PRIVATE HEALTH INSURANCE

## 2023-01-03 MED ADMIN — gadopiclenol (ELUCIREM,VUEWAY) injection 6 mL: 6 mL | INTRAVENOUS | @ 22:00:00 | Stop: 2023-01-03

## 2023-01-14 DIAGNOSIS — K769 Liver disease, unspecified: Principal | ICD-10-CM

## 2023-01-14 DIAGNOSIS — R932 Abnormal findings on diagnostic imaging of liver and biliary tract: Principal | ICD-10-CM

## 2023-01-14 NOTE — Unmapped (Signed)
Good afternoon [name]     I have reviewed your MRI results an based on the findings they recommend that you have further workup/evaluation by a hepatoogist (liver specialist) - I will be placing that referral and you should hear from them to schedule within 1-2 weeks     Tracey Randall

## 2023-02-22 NOTE — Unmapped (Signed)
Tracey Randall requested a refill of their Zepbound via IVR/Web. [facility] has scheduled delivery per the patients request via Same Day Courier to be delivered to their prescription address on 02/24/23.

## 2023-02-24 NOTE — Unmapped (Signed)
Tracey Randall 's Zepbound shipment will be delayed as a result of insufficient inventory of the drug.     I have spoken with the patient  at [phone]  and communicated the delivery change. We will reschedule the medication for the delivery date that the patient agreed upon.  We have confirmed the delivery date as 02/25/23

## 2023-02-25 MED FILL — ZEPBOUND 7.5 MG/0.5 ML SUBCUTANEOUS PEN INJECTOR: SUBCUTANEOUS | 28 days supply | Qty: 2 | Fill #1

## 2023-03-04 ENCOUNTER — Ambulatory Visit: Admit: 2023-03-04 | Discharge: 2023-03-05 | Payer: PRIVATE HEALTH INSURANCE

## 2023-03-04 DIAGNOSIS — K769 Liver disease, unspecified: Principal | ICD-10-CM

## 2023-03-04 DIAGNOSIS — R932 Abnormal findings on diagnostic imaging of liver and biliary tract: Principal | ICD-10-CM

## 2023-03-04 LAB — IRON & TIBC
IRON SATURATION: 24 % (ref 20–55)
IRON: 77 ug/dL (ref 50–170)
TOTAL IRON BINDING CAPACITY: 315 ug/dL (ref 250–425)

## 2023-03-04 LAB — CBC
HEMATOCRIT: 40 % (ref 34.0–44.0)
HEMOGLOBIN: 14.4 g/dL (ref 11.3–14.9)
MEAN CORPUSCULAR HEMOGLOBIN CONC: 36.1 g/dL — ABNORMAL HIGH (ref 32.0–36.0)
MEAN CORPUSCULAR HEMOGLOBIN: 30.5 pg (ref 25.9–32.4)
MEAN CORPUSCULAR VOLUME: 84.5 fL (ref 77.6–95.7)
MEAN PLATELET VOLUME: 10.1 fL (ref 6.8–10.7)
PLATELET COUNT: 301 10*9/L (ref 150–450)
RED BLOOD CELL COUNT: 4.73 10*12/L (ref 3.95–5.13)
RED CELL DISTRIBUTION WIDTH: 12.8 % (ref 12.2–15.2)
WBC ADJUSTED: 9.5 10*9/L (ref 3.6–11.2)

## 2023-03-04 LAB — COMPREHENSIVE METABOLIC PANEL
ALBUMIN: 4.2 g/dL (ref 3.4–5.0)
ALKALINE PHOSPHATASE: 87 U/L (ref 46–116)
ALT (SGPT): 20 U/L (ref 10–49)
ANION GAP: 12 mmol/L (ref 5–14)
AST (SGOT): 13 U/L (ref <=34)
BILIRUBIN TOTAL: 0.7 mg/dL (ref 0.3–1.2)
BLOOD UREA NITROGEN: 14 mg/dL (ref 9–23)
BUN / CREAT RATIO: 20
CALCIUM: 9.9 mg/dL (ref 8.7–10.4)
CHLORIDE: 103 mmol/L (ref 98–107)
CO2: 24.7 mmol/L (ref 20.0–31.0)
CREATININE: 0.69 mg/dL (ref 0.55–1.02)
EGFR CKD-EPI (2021) FEMALE: >90 mL/min/{1.73_m2} (ref >=60)
GLUCOSE RANDOM: 86 mg/dL (ref 70–99)
POTASSIUM: 4 mmol/L (ref 3.4–4.8)
PROTEIN TOTAL: 7.9 g/dL (ref 5.7–8.2)
SODIUM: 140 mmol/L (ref 135–145)

## 2023-03-04 LAB — FERRITIN: FERRITIN: 162.4 ng/mL (ref 7.3–270.7)

## 2023-03-04 LAB — PROTIME-INR
INR: 1.05
PROTIME: 12 s (ref 9.9–12.6)

## 2023-03-04 LAB — AFP TUMOR MARKER: AFP-TUMOR MARKER: 3 ng/mL (ref <=8)

## 2023-03-04 NOTE — Unmapped (Signed)
 [place] DIVISION OF GASTROENTEROLOGY AND HEPATOLOGY  [facility] L[facility]    FIBROSCAN will be performed to assess hepatic fibrosis (scarring) in order to stage this patient's liver disease. This will assist with evaluating the natural course of the disease and will provide important information regarding prognosis, duration of therapy, and potential response to treatment. This information will also help assess risk for hepatocellular carcinoma and need for liver cancer surveillance.    FibroscanProcedure:  After obtaining verbal consent, the patient was placed in a supine position. Physical characteristics, body habitus, and landmarks were assessed to establish appropriate midaxillary intercostal space for probe placement.    FibroScan 630 Expert Serial # [id number]    Probe  []  M+ Serial # [id number]  [x]  XL+ Serial # [id number]    Main Etiology of Liver Disease:  [] Hepatitis C (HCV)     [] Hepatitis B (HBV)  [] Alcohol  [] NAFLD     [] PBC       [] PSC  [] Autoimmune Hepatitis       [] Elevated hepatic enzymes       [x] Other   Abnormal MRI of Liver                Skin to liver capsule distance and liver parenchyma were accessed during the entire examination with the FibroScan probe. The patient was instructed to breathe normally and to abstain from sudden movements during the procedure, allowing for random measurements of liver stiffness. 50Hz  shear wave pulses were applied and the resulting shear wave and propagation speed detected with a 3.5 MHz ultrasonic signal, using the FibroScan probe. At least ten Shear Waves were produced; individual measurements of each shear wave were calculated. Patient tolerated the procedure well and was discharged without incident.    FibroScan score __5.5__ kPa   IQR/Med __16__ %   CAP __271__ dB/m     Test performed by: Vicki Mallet, RN      --------------------------------------------------------------------  RESULTS REPORT - Physician???s interpretation    Estimation of the stage of liver fibrosis (Metavir Score): The results of the Liver Stiffness Score are consistent with the following liver fibrosis stage:    [x]  F0-F1  []  F2   []  F3   [] F4           GENERAL RECOMMENDATIONS ACCORDING TO THE STAGE OF LIVER FIBROSIS.    F0-F1: No-minimal fibrosis. The risk of progression to advanced fibrosis and cirrhosis is low. If the cause of liver disease is not removed, a 1-2 yr follow-up study is recommended. F2: Significant fibrosis. There is a moderate risk of progression to cirrhosis. If the cause of liver disease is not removed, a follow-up study in 12 months is recommended.  F3: Advanced (pre-cirrhotic stage). The risk of progression to cirrhosis is high. Imaging studies to rule out hepatocellular carcinoma should be considered. Efforts to remove the cause of liver disease are highly recommended.  F4: Cirrhosis. There is significant risk of portal hypertension and esophageal varices. An upper endoscopy is recommended. Imaging studies for hepatocellular carcinoma screening are recommended.    Any and all FibroScan studies must be carefully evaluated, taking fully into account all individual measurement/scans, patient history and other factors. As with liver biopsy, any estimation of liver fibrosis may be subject to under or over staging due to sampling error. Any further medical or surgical intervention should be made only while fully considering the circumstances of this patient and in consultation with this patient.    I have reviewed and interpreted the FIBROSCAN test results  as described above.       F 0-1 fibrosis  S 2 (33-66%) steatosis    A. Yolonda Kida, MD, [facility]  Professor of Medicine  Division of Gastroenterology and Hepatology  [facility] [place] [facility]  [address]  [id number]  [place], [place] [address]    Phone [phone]  Fax [phone]

## 2023-03-04 NOTE — Unmapped (Addendum)
[facility]  --labs today   --continue to work on weight loss as discussed  --fibroscan reassuring with minimal scarring and stage 2 fatty infiltration    Liver lesion: will need another MRI with EOVIST contrast to tell what type of lesion this is for sure. I will order this now and you can schedule when it makes sense for you   --in the interim, please discuss with OB about coming off of estrogen OCP, progestin only is fine.     Iron overload:   --will check ferritin/ iron studies today      Call  my nurse Lanora Manis with any concerns or questions  [phone]    Or message me in [employer]

## 2023-03-04 NOTE — Unmapped (Signed)
 [facility]  03/04/2023    Reason for visit: New consultation requested by Amie Portland, FNP for evaluation of Hepatic steatosis [K76.0]    Assessment/Plan:      43 y.o. female with Steatotic Liver Disease, compound heterozygosity for Hereditary Hemochromatosis, and liver lesion    MASH (obesity, insulin resistance, HTN, HLD, dysglycemia). Fibroscan without signifcant fibrosis, stage II steatosis  --continue to engage with BED therapist  --diet/ exercise cornerstone of treatment for MASLD  --tight control of metabolic risk factors  --agree with GLP- 1  --Fibroscan  --LFTs      Compound heterozygosity for Hereditary Hemochromatosis (C282Y/ H63D): [facility] recommended therapeutic phlebotomy but d/t financial constraints, pt never had done.  Even though her iron saturation is within normal limits, hyperferritinemia appears to be an additional risk factor for steatohepatitis, proposed to lead to oxidative injury to the liver. Iron/ ferritin WNL with no significant fibrosis  and normal liver enzymes. Can likely hold on phlebtomy. C/s if ferritin rises  --iron/ ferritin q 6 months  --liver enzymes      Liver lesions: indeterminate liver lesion stable on prior ultrasounds. Now recent MRI 12/2022 with  multiple (10) liver lesions, ddx includes FNH vs Adenomas, less likely HCC or malignancy given no hx of cirrhosis.  hx of obesity and PCOS, on OCPs  --repeat MRI with EOVIST to characterize liver lesion (FNH vs adenoma) in March 2025.   --discussed that if these lesions are adenomas, will need to stop estrogen based OCP (progesterone only fine)   --AFP    Health maintenance:   --HBV not immune  --HAV not immune  --Colonoscopy < 21 yo      Return in about 6 months (around 09/01/2023).        Reeves Forth. Giana Castner, ANP- [facility]  [facility]     Subjective   History of Present Illness   Accompanied by: N/A (unaccompanied)    43 y.o. female with SLD, liver lesion and complex heterozygosity for hereditary hemochromatosis. She saw GI through [facility] in the past for SLD and liver lesion (felt to be benign)  Was getting annual MRI but did not follow up due to cost of MRI and office visits.   She is also complex heterozygote for HFE. [facility] advised weight loss and therapeutic phlebotomy for iron overload, which she never did. Unable to donate blood in past d/t exposure to TB while growing up in [place]  Her PCP agreed to follow ferritin and iron  Takes HDS for hair/ nails  On OCP for PCOS with [facility]  Follows with Dr Loa Socks for weight loss  Seeing a therapist for Binge Eating Disorder   Rare alcohol use  Has been on GLP- 1 in past but stopped when she started fertility tx        PMH: PCOS, asthma, allergies, anxiety, C282Y/ H63D compound heterozygote, obesity, dyslipidemia (diet controlled), dysglycemia (preDM)  FH: hereditary hemochromatosis on paternal side, no hx of advanced liver disease  SH: rare alcohol use, single, no illicit substances, no children, originally from [place]     Objective   Physical Exam   Vital Signs:   Vitals:    03/04/23 0921   BP: 114/85   Pulse: 90   Temp: 36.4 ??C (97.5 ??F)   SpO2: 99%     Constitutional: She is in no apparent distress  Eyes: Anicteric sclerae  Cardiovascular: No peripheral edema  Gastrointestinal: Soft, nontender abdomen without hepatosplenomegaly, hernias, or masses  Neurologic: Awake,  alert, and oriented to person, place, and time with normal speech and no asterixis      Studies:   Fibroscan 03/04/2023: 5.5 kpa, cap 271  [place] 04/2022:    Show images for US Abdomen Limited    Impression   Enlarged, steatotic liver with multiple hypoechoic lesions throughout, the largest measuring up to 2.3 x 2.6 x 3.3 cm in the right lobe. Recommend MRI of the abdomen with and without contrast for further characterization.     MRI 01/03/2023  Impression      -- Multiple (around 10 in number) bilobar T2 mildly hyperintense lesions with isointense T1 signal are identified in the liver. The lesions demonstrate arterial phase enhancement and predominantly later fading without obvious evidence of washout. The differential diagnosis includes multiple hepatic adenomas. Focal nodular hyperplasias cannot be excluded but is felt less likely. HCC cannot be excluded; however, in this patient without history of chronic liver disease, HCC diagnosis is less likely. Similarly, since the patient does not have a primary malignancy, hypervascular metastatic disease is unlikely. Comparison to prior ultrasound is limited since this to different imaging techniques have different accuracies. Hepatology consultation with further work-up is recommended. Further assessment with MRI with Eovist is recommended in 3 months     Results for orders placed or performed in visit on 03/04/23   AFP tumor marker   Result Value Ref Range    AFP-Tumor Marker 3 <=8 ng/mL   Phosphatidylethanol (PEth)   Result Value Ref Range    PEth 16:0/18:1 (POPEth) by LC-MS/MS <10 Cutoff: 10 ng/mL    PEth 16:0/18:2 (PLPEth) by LC-MS/MS <10 Cutoff: 10 ng/mL    PEth Interpretation Negative.    Hepatitis B Core Antibody, total   Result Value Ref Range    Hep B Core Total Ab Nonreactive Nonreactive   Hepatitis B Surface Antibody   Result Value Ref Range    Hep B S Ab Nonreactive Nonreactive, Grayzone    Hep B Surf Ab Quant <8.00 <8.00 m(IU)/mL   Hepatitis A IgG   Result Value Ref Range    Hep A IgG Nonreactive Nonreactive   PT-INR   Result Value Ref Range    PT 12.0 9.9 - 12.6 sec    INR 1.05    Comprehensive Metabolic Panel   Result Value Ref Range    Sodium 140 135 - 145 mmol/L    Potassium 4.0 3.4 - 4.8 mmol/L    Chloride 103 98 - 107 mmol/L    CO2 24.7 20.0 - 31.0 mmol/L    Anion Gap 12 5 - 14 mmol/L    BUN 14 9 - 23 mg/dL    Creatinine 5.40 9.81 - 1.02 mg/dL    BUN/Creatinine Ratio 20     eGFR CKD-EPI (2021) Female >90 >=60 mL/min/1.9m2    Glucose 86 70 - 99 mg/dL    Calcium 9.9 8.7 - 19.1 mg/dL    Albumin 4.2 3.4 - 5.0 g/dL    Total Protein 7.9 5.7 - 8.2 g/dL    Total Bilirubin 0.7 0.3 - 1.2 mg/dL    AST 13 <=47 U/L    ALT 20 10 - 49 U/L    Alkaline Phosphatase 87 46 - 116 U/L   CBC   Result Value Ref Range    WBC 9.5 3.6 - 11.2 10*9/L    RBC 4.73 3.95 - 5.13 10*12/L    HGB 14.4 11.3 - 14.9 g/dL    HCT 82.9 56.2 - 13.0 %  MCV 84.5 77.6 - 95.7 fL    MCH 30.5 25.9 - 32.4 pg    MCHC 36.1 (H) 32.0 - 36.0 g/dL    RDW 56.3 87.5 - 64.3 %    MPV 10.1 6.8 - 10.7 fL    Platelet 301 150 - 450 10*9/L   Iron & TIBC   Result Value Ref Range    Iron 77 50 - 170 ug/dL    TIBC 329 518 - 841 ug/dL    Iron Saturation (%) 24 20 - 55 %   Ferritin   Result Value Ref Range    Ferritin 162.4 7.3 - 270.7 ng/mL     Labs/ Studies:   11/05/11: ALT 39, AST 21, alk phos 61, tot bili 0.6, albumin 4.1, HgBA1C 5.5%, tot chol 213, LDL 131, TG 193, 25 OH vitamin D 18.5, WBC 9.3, HCT 39.9, PLT 330  11/12/13: ALT 75, AT 57, alk phos 46, tot bili 0.5, albumin 4.4, HgBA1C 5.4%, tot chol 211, LDL 124, TG 184, 25 OH vitamin D 32.5, WBC 9.3, HCT 42.8, PLT 301  12/22/14: NASH FibroSure- Marked steatosis, no fibrosis, borderline NASH, smooth muscle ab 7, alpha 1 AT 171, ANA negative, ferritin 1219, iron sat 34%, ceruloplasmin 40.1, normal immunoglobulin profile  01/06/15: Liver ultrasound- fatty echogenicity. Focal L lobe lesion (1.5 cm), likely cyst vs focal fat sparing.  04/13/15: HgbA1C 5.5%  07/18/15: ALT 33, AST 22, alk phos 67, tot bili 0.3, albumin 3.3, HFE mutation- C282Y/H63D complex heterozygote  08/29/15: Liver ultrasound- decreased fatty echogenicity, stable L lobe lesion, additional liver lesions vs areas of fat sparing   I personally spent 60 minutes face-to-face and non-face-to-face in the care of this patient, which includes all pre, intra, and post visit time on the date of service

## 2023-03-05 LAB — HEPATITIS B SURFACE ANTIBODY
HEPATITIS B SURFACE ANTIBODY QUANT: <8 m[IU]/mL (ref <8.00)
HEPATITIS B SURFACE ANTIBODY: NONREACTIVE

## 2023-03-05 LAB — HEPATITIS A IGG: HEPATITIS A IGG: NONREACTIVE

## 2023-03-05 LAB — HEPATITIS B CORE ANTIBODY, TOTAL: HEPATITIS B CORE TOTAL ANTIBODY: NONREACTIVE

## 2023-03-07 LAB — PHOSPHATIDYLETHANOL (PETH)
PETH 16:0/18:1 (POPETH) BY LC-MS/MS: <10 ng/mL
PETH 16:0/18:2 (PLPETH) BY LC-MS/MS: <10 ng/mL
PETH INTERPRETATION: NEGATIVE

## 2023-04-02 NOTE — Unmapped (Addendum)
 Tracey Randall is a 43 y.o. female with Class 2 obesity due to stress induced weight gain from bullying during high school. Patient's weight has been yoyo-ing 160-250 lb during her 20's.     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     Weight Summary:  Starting weight/BMI/WC: 250 lb, 5'7--BMI 39.1 (06/02/18, self reported), WC 44 (08/18/18, 1st IP visit)  Today's weight/BMI: 87.9 kg (193 lb 12.8 oz), BMI (!) 30.35 (04/17/2023)  Today's Waist Circumference: 38 inches (04/17/2023) <= 44 (03/14/20)    Referred by: Sharilyn Sites, MD in [facility].    GOALS: 1) Strive for 75-90 grams of protein per day (~30 grams per meal). 2) Incorporate resistance training into physical activity regimen 30 mins 2x weekly.     I have reviewed the patient's medical history, lifestyle history and labs/tests.   My recommendations include the following:    Lifestyle Pattern Summary: See GOALS above.     Medication: Patient is taking medication as directed. Patient is tolerating Zepbound 7.5 mg every other week injections (out of pocket) and Metformin 500 mg BID with meals well with no s/e. Discussed s/e profile and decided to continue with current medication regimen. Patient verbally agreed and will message if she want to switch to lily direct.     CONTINUE: Zepbound 7.5 mg every other week injections, CONTINUE Metformin 500 mg BID with meals    Tried:  - Metformin: taking 500 mg BID for PCOS; no weight loss.  - Topamax: started on 06/02/18 at 250 lb. Stopped due to persistent tingling in fingers.  - Wegovy--previous good WL, difficulty with insurance & supply shortages  Contraindicated:  - phentermine/Wellbutrin: not good options due to elevated anxiety  - Zepbound: lily direct    Stress: Patient is working with a Paramedic on BED. Binge eating is anxiety driven and she is working on coping mechanisms. Referral sent for appetite awareness program. Patient will look into this program. (04/17/23).     Obesity Surgery: Discussed benefits of surgical treatment for obesity. Patient is nervous about this option d/t a cousin who had adverse outcomes. Referral sent 05/16/22 and patient established with Bariatric team. Patient decided not to pursue to option at this time. Will continue to follow (04/17/23)    MASLD/NAFLD Screening:  Points <1.45: Cirrhosis less likely  FIB-4 Calculation: 0.9 at 04/24/2022  8:35 AM  Calculated from:  SGOT/AST: 78 U/L at 04/24/2022  8:35 AM  SGPT/ALT: 147 U/L at 04/24/2022  8:35 AM  Platelets: 280 10*9/L at 04/24/2022  8:35 AM  Age: 27 years     Medical conditions:  Glaucoma: no  Seizures: no  Medullary thyroid cancer (personal or family history): no  Multiple Endocrine Neoplasia: no  Palpitations/Tachycardia: no  Chest Pain: no past history of MI.   Headaches/Migraines: no  Nephrolithiasis: no  H/o pancreatitis: no  GERD: no     Prior Surgeries:  Cholecystectomy: no  Hysterectomy: no    Birth Control Methods: OCP

## 2023-04-02 NOTE — Unmapped (Signed)
Previous A1C of 5.4%. Moderate acanthosis nigricans on PE. Reviewed relevant medications and labs. Patient is following our Weight Management Clinic. See Obesity tab for medication changes.    Lab Results   Component Value Date    A1C 5.4 04/24/2022    A1C 4.7 (L) 04/19/2021    A1C 4.8 10/27/2020

## 2023-04-02 NOTE — Unmapped (Signed)
Reviewed relevant medications and labs. Patient is following our [facility]. See Obesity tab for medication changes.    Lab Results   Component Value Date    CHOL 224 (H) 04/24/2022    CHOL 199 04/19/2021    CHOL 196 01/17/2021    LDL 127 (H) 04/24/2022    LDL 122 (H) 04/19/2021    LDL 125 (H) 01/17/2021    HDL 48 04/24/2022    HDL 51 04/19/2021    HDL 48 01/17/2021    TRIG 308 (H) 04/24/2022    TRIG 130 04/19/2021    TRIG 114 01/17/2021

## 2023-04-02 NOTE — Unmapped (Signed)
 [facility] Follow Up    Assessment/Plan:     Chief Complaint   Patient presents with    Weight Management     Problem List Items Addressed This Visit          Unprioritized    Class 2 obesity due to excess calories with body mass index (BMI) of 37.0 to 37.9 in adult    Tracey Randall is a 43 y.o. female with Class 2 obesity due to stress induced weight gain from bullying during high school. Patient's weight has been yoyo-ing 160-250 lb during her 20's.     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     Weight Summary:  Starting weight/BMI/WC: 250 lb, 5'7--BMI 39.1 (06/02/18, self reported), WC 44 (08/18/18, 1st IP visit)  Today's weight/BMI: 87.9 kg (193 lb 12.8 oz), BMI (!) 30.35 (04/17/2023)  Today's Waist Circumference: 38 inches (04/17/2023) <= 44 (03/14/20)    Referred by: Sharilyn Sites, MD in [facility].    GOALS: 1) Strive for 75-90 grams of protein per day (~30 grams per meal). 2) Incorporate resistance training into physical activity regimen 30 mins 2x weekly.     I have reviewed the patient's medical history, lifestyle history and labs/tests.   My recommendations include the following:    Lifestyle Pattern Summary: See GOALS above.     Medication: Patient is taking medication as directed. Patient is tolerating Zepbound 7.5 mg every other week injections (out of pocket) and Metformin 500 mg BID with meals well with no s/e. Discussed s/e profile and decided to continue with current medication regimen. Patient verbally agreed and will message if she want to switch to lily direct.     CONTINUE: Zepbound 7.5 mg every other week injections, CONTINUE Metformin 500 mg BID with meals    Tried:  - Metformin: taking 500 mg BID for PCOS; no weight loss.  - Topamax: started on 06/02/18 at 250 lb. Stopped due to persistent tingling in fingers.  - Wegovy--previous good WL, difficulty with insurance & supply shortages  Contraindicated:  - phentermine/Wellbutrin: not good options due to elevated anxiety  - Zepbound: lily direct    Stress: Patient is working with a Paramedic on BED. Binge eating is anxiety driven and she is working on coping mechanisms. Referral sent for appetite awareness program. Patient will look into this program. (04/17/23).     Obesity Surgery: Discussed benefits of surgical treatment for obesity. Patient is nervous about this option d/t a cousin who had adverse outcomes. Referral sent 05/16/22 and patient established with Bariatric team. Patient decided not to pursue to option at this time. Will continue to follow (04/17/23)    MASLD/NAFLD Screening:  Points <1.45: Cirrhosis less likely  FIB-4 Calculation: 0.9 at 04/24/2022  8:35 AM  Calculated from:  SGOT/AST: 78 U/L at 04/24/2022  8:35 AM  SGPT/ALT: 147 U/L at 04/24/2022  8:35 AM  Platelets: 280 10*9/L at 04/24/2022  8:35 AM  Age: 60 years     Medical conditions:  Glaucoma: no  Seizures: no  Medullary thyroid cancer (personal or family history): no  Multiple Endocrine Neoplasia: no  Palpitations/Tachycardia: no  Chest Pain: no past history of MI.   Headaches/Migraines: no  Nephrolithiasis: no  H/o pancreatitis: no  GERD: no     Prior Surgeries:  Cholecystectomy: no  Hysterectomy: no    Birth Control Methods: OCP  Prediabetes - Primary    Previous A1C of 5.4%. Moderate acanthosis nigricans on PE. Reviewed relevant medications and labs. Patient is following our Weight Management Clinic. See Obesity tab for medication changes.    Lab Results   Component Value Date    A1C 5.4 04/24/2022    A1C 4.7 (L) 04/19/2021    A1C 4.8 10/27/2020               NAFLD (nonalcoholic fatty liver disease)    Fibroscan on 03/04/23 showed F2. MRI on 06/14/22 showed normal liver size with normal liver contours. [place] 04/30/22 showed the liver was mildly enlarged and heterogenous in echotexture. Multiple hypoechoic lesions throughout the liver. History of hemochromatosis. FIB4: 0.9. History of elevated LFTs. Patient is followed by GI/hepatology. Reviewed relevant medications and labs. Patient is following our Weight Management Clinic. See Obesity tab for medication changes.     Lab Results   Component Value Date    AST 13 03/04/2023    AST 21 12/04/2022    AST 78 (H) 04/24/2022    ALT 20 03/04/2023    ALT 42 12/04/2022    ALT 147 (H) 04/24/2022    PLT 301 03/04/2023    PLT 280 04/24/2022    PLT 268 08/01/2021     MASLD/NAFLD Screening:  Points <1.45: Cirrhosis less likely  FIB-4 Calculation: 0.9 at 04/24/2022  8:35 AM  Calculated from:  SGOT/AST: 78 U/L at 04/24/2022  8:35 AM  SGPT/ALT: 147 U/L at 04/24/2022  8:35 AM  Platelets: 280 10*9/L at 04/24/2022  8:35 AM  Age: 22 years            Dyslipidemia    Reviewed relevant medications and labs. Patient is following our Weight Management Clinic. See Obesity tab for medication changes.    Lab Results   Component Value Date    CHOL 224 (H) 04/24/2022    CHOL 199 04/19/2021    CHOL 196 01/17/2021    LDL 127 (H) 04/24/2022    LDL 122 (H) 04/19/2021    LDL 125 (H) 01/17/2021    HDL 48 04/24/2022    HDL 51 04/19/2021    HDL 48 01/17/2021    TRIG 295 (H) 04/24/2022    TRIG 130 04/19/2021    TRIG 114 01/17/2021                  Return in about 4 months (around 08/17/2023) for Weight clinic F/U one slot..    I have reviewed and addressed the patient???s adherence and response to prescribed medications. I have identified patient barriers to following the proposed medication and treatment plan, and have noted opportunities to optimize healthy behaviors. I have answered the patient???s questions to satisfaction and the patient voices understanding.    Time: Greater than 50% of this encounter was spent in direct consultation with the patient in evaluation and discussing all of the above. Duration of encounter: 20 minutes.     HPI:     Tracey Randall is a 43 y.o. female who  has a past medical history of Anxiety, Diabetes mellitus, GERD (gastroesophageal reflux disease), Mild intermittent asthma without complication (02/09/2014), and Obesity. who presents today for [facility] follow up.    Weight Management History  Wt Readings from Last 6 Encounters:   04/17/23 87.9 kg (193 lb 12.8 oz)   03/04/23 94.3 kg (208 lb)   12/04/22 96.6 kg (213 lb)   08/08/22 (!) 102.3 kg (225 lb 9.6 oz)  06/10/22 (!) 109.5 kg (241 lb 6.5 oz)   06/10/22 (!) 109.5 kg (241 lb 4.8 oz)         06/06/2021     2:23 PM 09/06/2021     2:26 PM 12/26/2021     9:15 AM 04/24/2022     9:16 AM 08/08/2022    10:30 AM 12/04/2022     3:10 PM 04/17/2023     9:05 AM   Waist Circumference   Waist Circumference 42 inches 43 inches 47.5 inches 46.5 inches 44.5 inches 41 inches 38 inches           Update: Patient lost 20 lbs in 4 months.   Medication: Patient is taking medication as directed. Patient is tolerating Zepbound 7.5 mg every other week injections (out of pocket) and Metformin 500 mg BID with meals well with no s/e.  Eating Pattern:  - Fast food/Restaurants/Takeout: 0-1x per week.   - Breakfast: eggs, oatmeal/grits, wheat toast, protein shake, and fruit  - Lunch: salad, soup, lean meat, pork, fish, veggies, leftovers, fruit, and protein shake  - Dinner: salad, soup, grilled chicken, lean meat, pork, fish, veggies, leftovers, fruits, and protein shake  - Drinks: water without Crystal Lite and hot tea without sweetener 5x/weekly  - Snacks: fruit, vegetables, granola bars, nuts, and popcorn  Physical Activity:  walking, running, and lifting weights for 30 mins, 7x per week.   Barrier: None discussed.     Lab Results   Component Value Date    A1C 5.4 04/24/2022    GLU 86 03/04/2023    CHOL 224 (H) 04/24/2022    LDL 127 (H) 04/24/2022    HDL 48 04/24/2022    TRIG 161 (H) 04/24/2022    AST 13 03/04/2023    ALT 20 03/04/2023    PLT 301 03/04/2023    TSH 1.373 04/24/2022    VITDTOTAL 33.2 04/24/2022     Past Medical/Surgical History:     Past Medical History:   Diagnosis Date Anxiety     Diabetes mellitus     GERD (gastroesophageal reflux disease)     Mild intermittent asthma without complication 02/09/2014    Obesity      No past surgical history on file.    Social History:     Social History     Socioeconomic History    Marital status: Single     Spouse name: None    Number of children: 0    Years of education: None    Highest education level: None   Occupational History     Comment: Works in Education officer, environmental at [employer]   Tobacco Use    Smoking status: Former     Current packs/day: 0.00     Types: Cigarettes     Start date: 01/22/2000     Quit date: 01/21/2001     Years since quitting: 22.2     Passive exposure: Never    Smokeless tobacco: Never   Vaping Use    Vaping status: Never Used   Substance and Sexual Activity    Alcohol use: Yes     Comment: Intermittent but less than once a month    Drug use: No    Sexual activity: Not Currently   Social History Narrative    Is from [place], [place], moved to [place] at age 60.    She is single.     No children. She has PCOS. Has tried fertility treatments.     Lives with  her mother and father and helps care for them.    She went to [facility].    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.     Social Drivers of Psychologist, prison and probation services Strain: Low Risk  (04/24/2022)    Overall Financial Resource Strain (CARDIA)     Difficulty of Paying Living Expenses: Not hard at all   Food Insecurity: No Food Insecurity (04/24/2022)    Hunger Vital Sign     Worried About Running Out of Food in the Last Year: Never true     Ran Out of Food in the Last Year: Never true   Transportation Needs: No Transportation Needs (04/24/2022)    PRAPARE - Therapist, art (Medical): No     Lack of Transportation (Non-Medical): No   Physical Activity: Insufficiently Active (03/15/2020)    Exercise Vital Sign     Days of Exercise per Week: 3 days     Minutes of Exercise per Session: 30 min   Housing: Low Risk  (04/24/2022) Housing     Within the past 12 months, have you ever stayed: outside, in a car, in a tent, in an overnight shelter, or temporarily in someone else's home (i.e. couch-surfing)?: No     Are you worried about losing your housing?: No       Family History:     Family History   Problem Relation Age of Onset    Hypertension Mother     Heart disease Father     Stroke Father     Hypertension Father     Glaucoma Father     Hemochromatosis Paternal Aunt     Hemochromatosis Paternal Uncle     Mental illness Neg Hx     Substance Abuse Disorder Neg Hx        Allergies:     Alpha-gal (galactose-alpha-1,3-galactose); Klonopin [clonazepam]; Lexapro [escitalopram oxalate]; and Tramadol    Current Medications:     Current Outpatient Medications   Medication Sig Dispense Refill    albuterol HFA 90 mcg/actuation inhaler Inhale 2 puffs every six (6) hours as needed. 8 g 2    ALPRAZolam (XANAX) 0.25 MG tablet Take 1 tablet (0.25 mg total) by mouth every eight (8) hours as needed for anxiety. 30 tablet 0    cetirizine-pseudoephedrine (ZYRTEC-D) 5-120 mg per tablet 1-2 daily as needed 60 tablet 2    cholecalciferol, vitamin D3, 2,000 unit Tab Take 1 tablet (50 mcg total) by mouth daily.      cyanocobalamin, vitamin B-12, 1000 MCG tablet Take 1 tablet (1,000 mcg total) by mouth daily.      diphenhydrAMINE (BENADRYL) 25 mg capsule/tablet Take 1 each (25 mg total) by mouth every six (6) hours as needed for itching.      famotidine (PEPCID) 10 MG tablet Take 1 tablet (10 mg total) by mouth in the morning.      magnesium oxide (MAG-OX) 400 mg tablet Take 1 tablet (400 mg total) by mouth daily.      metFORMIN (GLUCOPHAGE) 500 MG tablet Take 1 tablet (500 mg total) by mouth two (2) times a day. 180 tablet 3    multivitamin, prenatal, folic acid-iron, 27-1 mg Tab Take 1 tablet by mouth daily.      norgestrel-ethinyl estradiol (LOW-OGESTREL, 28,) 0.3-30 mg-mcg per tablet Take 1 tablet by mouth daily. 84 tablet 4    OMEGA-3 FATTY ACIDS/FISH OIL (OMEGA 3 FISH OIL ORAL)  Take 1,200 mg by mouth daily.      tirzepatide (ZEPBOUND) 7.5 mg/0.5 mL injection pen Inject 7.5 mg under the skin every seven (7) days. 2 mL 2    tirzepatide (ZEPBOUND) 7.5 mg/0.5 mL injection pen Inject 7.5 mg under the skin every seven (7) days. 2 mL 4    vitamin E-268 mg, 400 UNIT, 268 mg (400 UNIT) capsule Take 1 capsule (268 mg total) by mouth daily.       No current facility-administered medications for this visit.       I have reviewed and (if needed) updated the patient's problem list, medications, allergies, past medical and surgical history, social and family history.    ROS:     A 12 point review of systems was negative except for pertinent items noted in the HPI     Vital Signs:     Body mass index is 30.35 kg/m??. Waist Circumference: 38 inches    Wt Readings from Last 3 Encounters:   04/17/23 87.9 kg (193 lb 12.8 oz)   03/04/23 94.3 kg (208 lb)   12/04/22 96.6 kg (213 lb)     Temp Readings from Last 3 Encounters:   03/04/23 36.4 ??C (97.5 ??F)   08/08/22 36.1 ??C (96.9 ??F) (Temporal)   04/24/22 36 ??C (96.8 ??F) (Temporal)     BP Readings from Last 3 Encounters:   04/17/23 128/89   03/04/23 114/85   12/04/22 133/88     Pulse Readings from Last 3 Encounters:   04/17/23 90   03/04/23 90   12/04/22 98        Physical Exam:     General: well appearing, in NAD, Body mass index is 30.35 kg/m??. Ambulatory without help.  Body fat distribution: General adiposity. No supraclavicular adiposity. No dorsal adiposity. Waist Circumference: 38 inches     Head: normocephalic atraumatic.  Eyes: PERRLA, EOMI, Sclera WNL.   Neck: supple, no LAD, no thyromegaly.  CV: RRR no rubs or murmurs.  Lungs: clear bilaterally to auscultation. No wheezing.  Extremities: no clubbing, cyanosis, No edema.  Skin: no concerning lesions, rashes observed. No lipomas  Neuro: Alert and oriented X 3.    Labs:     No visits with results within 1 Month(s) from this visit.   Latest known visit with results is:   Office Visit on 03/04/2023   Component Date Value Ref Range Status    AFP-Tumor Marker 03/04/2023 3  <=8 ng/mL Final    PEth 16:0/18:1 (POPEth) by LC-MS/MS 03/04/2023 <10  Cutoff: 10 ng/mL Final    Phosphatidylethanol (PEth) homologues result interpretation     PEth 16:0/18:1 (POPEth)  Less than 10 ng/mL: Not detected  10 - 19 ng/mL: Abstinence or light alcohol consumption  (<2 drinks per day for several days a week)  20 - 200 ng/mL: Moderate alcohol consumption  (up to 4 drinks per day for several days a week)  Greater than 200 ng/mL: Heavy alcohol consumption or   chronic alcohol use (at least 4 drinks per day several days   a week)     (Reference: W. Richardean Canal and Kevin Fenton 2018 J. Forensic Sci)    PEth 16:0/18:2 (PLPEth) by LC-MS/MS 03/04/2023 <10  Cutoff: 10 ng/mL Final    PEth 16:0/18:2 (PLPEth)  Reference ranges are not well established    PEth Interpretation 03/04/2023 Negative.   Final       -------------------ADDITIONAL INFORMATION-------------------  This report is intended for use in clinical monitoring and   management  of patients.  It is not intended for use in   employment-related testing.  This test was developed and its performance characteristics   determined by [facility] in a manner consistent with CLIA   requirements. This test has not been cleared or approved by   the U.S. Food and Drug Administration.     Test Performed by:  [facility]  [address] [place], [place], [place] [address]  Lab Director: Molli Hazard. Beverlyn Roux Ph.D.; CLIA# [id number]    Hep B Core Total Ab 03/04/2023 Nonreactive  Nonreactive Final    Hep B S Ab 03/04/2023 Nonreactive  Nonreactive, [place] Final    Hep B Surf Ab Quant 03/04/2023 <8.00  <8.00 m(IU)/mL Final    Hep A IgG 03/04/2023 Nonreactive  Nonreactive Final    PT 03/04/2023 12.0  9.9 - 12.6 sec Final    INR 03/04/2023 1.05   Final    Sodium 03/04/2023 140  135 - 145 mmol/L Final    Potassium 03/04/2023 4.0  3.4 - 4.8 mmol/L Final    Chloride 03/04/2023 103  98 - 107 mmol/L Final    CO2 03/04/2023 24.7  20.0 - 31.0 mmol/L Final    Anion Gap 03/04/2023 12  5 - 14 mmol/L Final    BUN 03/04/2023 14  9 - 23 mg/dL Final    Creatinine 52/84/1324 0.69  0.55 - 1.02 mg/dL Final    BUN/Creatinine Ratio 03/04/2023 20   Final    eGFR CKD-EPI (2021) Female 03/04/2023 >90  >=60 mL/min/1.27m2 Final    eGFR calculated with CKD-EPI 2021 equation in accordance with [employer] and [employer] of Nephrology Task Force recommendations.    Glucose 03/04/2023 86  70 - 99 mg/dL Final    Calcium 40/10/2723 9.9  8.7 - 10.4 mg/dL Final    Albumin 36/64/4034 4.2  3.4 - 5.0 g/dL Final    Total Protein 03/04/2023 7.9  5.7 - 8.2 g/dL Final    Total Bilirubin 03/04/2023 0.7  0.3 - 1.2 mg/dL Final    AST 74/25/9563 13  <=34 U/L Final    ALT 03/04/2023 20  10 - 49 U/L Final    Alkaline Phosphatase 03/04/2023 87  46 - 116 U/L Final    WBC 03/04/2023 9.5  3.6 - 11.2 10*9/L Final    RBC 03/04/2023 4.73  3.95 - 5.13 10*12/L Final    HGB 03/04/2023 14.4  11.3 - 14.9 g/dL Final    HCT 87/56/4332 40.0  34.0 - 44.0 % Final    MCV 03/04/2023 84.5  77.6 - 95.7 fL Final    MCH 03/04/2023 30.5  25.9 - 32.4 pg Final    MCHC 03/04/2023 36.1 (H)  32.0 - 36.0 g/dL Final    RDW 95/18/8416 12.8  12.2 - 15.2 % Final    MPV 03/04/2023 10.1  6.8 - 10.7 fL Final    Platelet 03/04/2023 301  150 - 450 10*9/L Final    Iron 03/04/2023 77  50 - 170 ug/dL Final    TIBC 60/63/0160 315  250 - 425 ug/dL Final    Iron Saturation (%) 03/04/2023 24  20 - 55 % Final    Ferritin 03/04/2023 162.4  7.3 - 270.7 ng/mL Final       Follow-up:     Return in about 4 months (around 08/17/2023) for Weight clinic F/U one slot..    I attest that I, Donzetta Matters, personally documented this note for [facility],  MD.      Donzetta Matters  04/17/2023     I attest that I have reviewed the note and that the components of the history of the present illness, the physical exam, and the assessment and plan documented were performed by me or were performed in my presence where I verified the documentation and performed (or re-performed) the exam and medical decision making.     Marshell Garfinkel, MD

## 2023-04-02 NOTE — Unmapped (Addendum)
 Fibroscan on 03/04/23 showed F2. MRI on 06/14/22 showed normal liver size with normal liver contours. [place] 04/30/22 showed the liver was mildly enlarged and heterogenous in echotexture. Multiple hypoechoic lesions throughout the liver. History of hemochromatosis. FIB4: 0.9. History of elevated LFTs. Patient is followed by GI/hepatology. Reviewed relevant medications and labs. Patient is following our Weight Management Clinic. See Obesity tab for medication changes.     Lab Results   Component Value Date    AST 13 03/04/2023    AST 21 12/04/2022    AST 78 (H) 04/24/2022    ALT 20 03/04/2023    ALT 42 12/04/2022    ALT 147 (H) 04/24/2022    PLT 301 03/04/2023    PLT 280 04/24/2022    PLT 268 08/01/2021     MASLD/NAFLD Screening:  Points <1.45: Cirrhosis less likely  FIB-4 Calculation: 0.9 at 04/24/2022  8:35 AM  Calculated from:  SGOT/AST: 78 U/L at 04/24/2022  8:35 AM  SGPT/ALT: 147 U/L at 04/24/2022  8:35 AM  Platelets: 280 10*9/L at 04/24/2022  8:35 AM  Age: 43 years

## 2023-04-17 ENCOUNTER — Ambulatory Visit
Admit: 2023-04-17 | Discharge: 2023-04-18 | Payer: PRIVATE HEALTH INSURANCE | Attending: Family Medicine | Primary: Family Medicine

## 2023-04-17 DIAGNOSIS — E785 Hyperlipidemia, unspecified: Principal | ICD-10-CM

## 2023-04-17 DIAGNOSIS — R7303 Prediabetes: Principal | ICD-10-CM

## 2023-04-17 DIAGNOSIS — K760 Fatty (change of) liver, not elsewhere classified: Principal | ICD-10-CM

## 2023-04-17 DIAGNOSIS — Z6837 Body mass index (BMI) 37.0-37.9, adult: Principal | ICD-10-CM

## 2023-04-17 DIAGNOSIS — E66812 Class 2 obesity due to excess calories with body mass index (BMI) of 37.0 to 37.9 in adult, unspecified whether serious comorbidity present: Principal | ICD-10-CM

## 2023-04-17 DIAGNOSIS — E6609 Other obesity due to excess calories: Principal | ICD-10-CM

## 2023-05-06 MED FILL — ZEPBOUND 7.5 MG/0.5 ML SUBCUTANEOUS PEN INJECTOR: SUBCUTANEOUS | 28 days supply | Qty: 2 | Fill #2

## 2023-07-17 DIAGNOSIS — E6609 Other obesity due to excess calories: Principal | ICD-10-CM

## 2023-07-17 DIAGNOSIS — E66812 Class 2 obesity due to excess calories with body mass index (BMI) of 37.0 to 37.9 in adult, unspecified whether serious comorbidity present: Principal | ICD-10-CM

## 2023-07-17 DIAGNOSIS — Z6837 Body mass index (BMI) 37.0-37.9, adult: Principal | ICD-10-CM

## 2023-07-17 MED ORDER — ZEPBOUND 7.5 MG/0.5 ML SUBCUTANEOUS PEN INJECTOR
SUBCUTANEOUS | 1 refills | 28.00000 days | Status: CP
Start: 2023-07-17 — End: ?

## 2023-07-17 NOTE — Unmapped (Signed)
 Pls advise.

## 2023-07-17 NOTE — Unmapped (Signed)
 Patient is requesting the following refill  Requested Prescriptions     Pending Prescriptions Disp Refills    tirzepatide  (ZEPBOUND ) 7.5 mg/0.5 mL injection pen 2 mL 2     Sig: Inject 7.5 mg under the skin every seven (7) days.       Recent Visits  Date Type Provider Dept   04/17/23 Office Visit Ro, Lauraine Gunner, MD [facility] [address] [facility]   12/04/22 Office Visit Ro, Lauraine Gunner, MD [facility] [address] [facility]   08/08/22 Office Visit Ro, Lauraine Gunner, MD [facility] [address] [place]   Showing recent visits within past 365 days and meeting all other requirements  Future Appointments  Date Type Provider Dept   08/20/23 Appointment Ro, Lauraine Gunner, MD [facility] [address] [place]   08/21/23 Appointment Gayle, Verneita Molt, FNP [facility] [address] [facility]   Showing future appointments within next 365 days and meeting all other requirements       Labs: Not applicable this refill

## 2023-07-18 MED ORDER — TIRZEPATIDE (WEIGHT LOSS) 7.5 MG/0.5 ML SUBCUTANEOUS SOLUTION
SUBCUTANEOUS | 0 refills | 30.00000 days | Status: CP
Start: 2023-07-18 — End: 2023-08-17

## 2023-07-18 NOTE — Unmapped (Signed)
 Addended by: LONNI JACQULYNN HERO on: 07/18/2023 09:01 AM     Modules accepted: Orders

## 2023-07-18 NOTE — Unmapped (Signed)
 Call placed to [facility], per rep the pharmacy processes vial, not the pen injections. Spoke with the patient who clarified she does want the vials to draw up herself. Prescription changed to reflect vial, sent to covering provider for signature.

## 2023-07-28 NOTE — Unmapped (Signed)
Reviewed relevant medications and labs. Patient is following our [facility]. See Obesity tab for medication changes.    Lab Results   Component Value Date    CHOL 224 (H) 04/24/2022    CHOL 199 04/19/2021    CHOL 196 01/17/2021    LDL 127 (H) 04/24/2022    LDL 122 (H) 04/19/2021    LDL 125 (H) 01/17/2021    HDL 48 04/24/2022    HDL 51 04/19/2021    HDL 48 01/17/2021    TRIG 308 (H) 04/24/2022    TRIG 130 04/19/2021    TRIG 114 01/17/2021

## 2023-07-28 NOTE — Unmapped (Addendum)
 Fibroscan on 03/04/23 showed F2. MRI on 06/14/22 showed normal liver size with normal liver contours. US  04/30/22 showed the liver was mildly enlarged and heterogenous in echotexture. Multiple hypoechoic lesions throughout the liver. History of hemochromatosis. FIB4: 0.9. History of elevated LFTs. Patient is followed by GI/hepatology. Reviewed relevant medications and labs. Patient is following our Weight Management Clinic. See Obesity tab for medication changes.     Lab Results   Component Value Date    AST 15 07/31/2023    AST 13 03/04/2023    AST 21 12/04/2022    ALT 14 07/31/2023    ALT 20 03/04/2023    ALT 42 12/04/2022    PLT 301 03/04/2023    PLT 280 04/24/2022    PLT 268 08/01/2021     MASLD/NAFLD Screening: (08/20/23)  Points <1.45: Cirrhosis less likely  FIB-4 Calculation: 0.39 at 03/04/2023 10:57 AM  Calculated from:  SGOT/AST: 13 U/L at 03/04/2023 10:56 AM  SGPT/ALT: 20 U/L at 03/04/2023 10:56 AM  Platelets: 301 10*9/L at 03/04/2023 10:57 AM  Age: 43 years

## 2023-07-28 NOTE — Unmapped (Addendum)
 Tracey Randall is a 43 y.o. female with Class 2 obesity due to stress induced weight gain from bullying during high school. Patient's weight has been yoyo-ing 160-250 lb during her 20's.     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     Weight Summary:  Starting weight/BMI/WC: 250 lb, 5'7--BMI 39.1 (06/02/18, self reported), WC 44 (08/18/18, 1st IP visit)  Today's weight/BMI: 81 kg (178 lb 9.6 oz), BMI 27.97 (08/20/2023)  Today's Waist Circumference: 35.5 inches (08/20/2023) <= 44 (03/14/20)    Referred by: Oneil Cruise, MD in [facility].    GOALS: 1) Incorporate resistance training into physical activity regimen 30 mins 2x weekly. Patient has a Photographer and weights at home. 2) Follow up with PCP re: thyroid nodule. 3) Continue to se therapist regularly 4) Continue with healthy eating pattern and physical activity regimen!      I have reviewed the patient's medical history, lifestyle history and labs/tests.   My recommendations include the following:    Lifestyle Pattern Summary: Continue to follow closely with therapist for anxiety and binge eating disorder. Patient notes no recent episodes of binge eating- states this has been helped a lot by Zepbound . See GOALS above.     Medication: Patient is taking Zepbound  7.5 mg qwk from [employer]- however, she is only taking this once every 3 weeks to save money. She is also taking metformin  500 mg BID. Discussed and decided to continue current regimen.     CONTINUE: metformin  500 mg BID   CONTINUE: Zepbound  7.5 mg q3wks ([employer])    Tried:  - Metformin : taking 500 mg BID for PCOS; no weight loss.  - Topamax : started on 06/02/18 at 250 lb. Stopped due to persistent tingling in fingers.  - Wegovy --previous good WL, difficulty with insurance & supply shortages  Contraindicated:  - phentermine/Wellbutrin: not good options due to elevated anxiety  - Zepbound : [employer]    Stress: Patient is working with a therapist on BED. Binge eating is anxiety driven and she is working on coping mechanisms. Referral sent for appetite awareness program. Patient will look into this program. (08/20/23).     Obesity Surgery: Discussed benefits of surgical treatment for obesity. Patient is nervous about this option d/t a cousin who had adverse outcomes. Referral sent 05/16/22 and patient established with Bariatric team. Patient decided not to pursue to option at this time. Will continue to follow (08/20/23)    MASLD/NAFLD Screening: (08/20/23)  Points <1.45: Cirrhosis less likely  FIB-4 Calculation: 0.39 at 03/04/2023 10:57 AM  Calculated from:  SGOT/AST: 13 U/L at 03/04/2023 10:56 AM  SGPT/ALT: 20 U/L at 03/04/2023 10:56 AM  Platelets: 301 10*9/L at 03/04/2023 10:57 AM  Age: 25 years    Medical conditions:  Glaucoma: no  Seizures: no  Medullary thyroid cancer (personal or family history): no  Multiple Endocrine Neoplasia: no  Palpitations/Tachycardia: no  Chest Pain: no past history of MI.   Headaches/Migraines: no  Nephrolithiasis: no  H/o pancreatitis: no  GERD: no     Prior Surgeries:  Cholecystectomy: no  Hysterectomy: no    Birth Control Methods: OCP

## 2023-07-28 NOTE — Unmapped (Addendum)
 Previous A1C of 5.4%. Mild acanthosis nigricans on PE. Reviewed relevant medications and labs. Patient is following our Weight Management Clinic. See Obesity tab for medication changes.    Lab Results   Component Value Date    A1C 5.4 04/24/2022    A1C 4.7 (L) 04/19/2021    A1C 4.8 10/27/2020

## 2023-07-28 NOTE — Unmapped (Signed)
 [facility] Follow Up    Assessment/Plan:     Chief Complaint   Patient presents with    Weight Management     Problem List Items Addressed This Visit          Unprioritized    Class 2 obesity due to excess calories with body mass index (BMI) of 37.0 to 37.9 in adult    Tracey Randall is a 43 y.o. female with Class 2 obesity due to stress induced weight gain from bullying during high school. Patient's weight has been yoyo-ing 160-250 lb during her 20's.     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     Weight Summary:  Starting weight/BMI/WC: 250 lb, 5'7--BMI 39.1 (06/02/18, self reported), WC 44 (08/18/18, 1st IP visit)  Today's weight/BMI: 81 kg (178 lb 9.6 oz), BMI 27.97 (08/20/2023)  Today's Waist Circumference: 35.5 inches (08/20/2023) <= 44 (03/14/20)    Referred by: Oneil Cruise, MD in [facility].    GOALS: 1) Incorporate resistance training into physical activity regimen 30 mins 2x weekly. Patient has a Photographer and weights at home. 2) Follow up with PCP re: thyroid nodule. 3) Continue to se therapist regularly 4) Continue with healthy eating pattern and physical activity regimen!      I have reviewed the patient's medical history, lifestyle history and labs/tests.   My recommendations include the following:    Lifestyle Pattern Summary: Continue to follow closely with therapist for anxiety and binge eating disorder. Patient notes no recent episodes of binge eating- states this has been helped a lot by Zepbound . See GOALS above.     Medication: Patient is taking Zepbound  7.5 mg qwk from [employer]- however, she is only taking this once every 3 weeks to save money. She is also taking metformin  500 mg BID. Discussed and decided to continue current regimen.     CONTINUE: metformin  500 mg BID   CONTINUE: Zepbound  7.5 mg q3wks ([employer])    Tried:  - Metformin : taking 500 mg BID for PCOS; no weight loss.  - Topamax : started on 06/02/18 at 250 lb. Stopped due to persistent tingling in fingers.  - Wegovy --previous good WL, difficulty with insurance & supply shortages  Contraindicated:  - phentermine/Wellbutrin: not good options due to elevated anxiety  - Zepbound : [employer]    Stress: Patient is working with a therapist on BED. Binge eating is anxiety driven and she is working on coping mechanisms. Referral sent for appetite awareness program. Patient will look into this program. (08/20/23).     Obesity Surgery: Discussed benefits of surgical treatment for obesity. Patient is nervous about this option d/t a cousin who had adverse outcomes. Referral sent 05/16/22 and patient established with Bariatric team. Patient decided not to pursue to option at this time. Will continue to follow (08/20/23)    MASLD/NAFLD Screening: (08/20/23)  Points <1.45: Cirrhosis less likely  FIB-4 Calculation: 0.39 at 03/04/2023 10:57 AM  Calculated from:  SGOT/AST: 13 U/L at 03/04/2023 10:56 AM  SGPT/ALT: 20 U/L at 03/04/2023 10:56 AM  Platelets: 301 10*9/L at 03/04/2023 10:57 AM  Age: 73 years    Medical conditions:  Glaucoma: no  Seizures: no  Medullary thyroid cancer (personal or family history): no  Multiple Endocrine Neoplasia: no  Palpitations/Tachycardia: no  Chest Pain: no past history of MI.   Headaches/Migraines: no  Nephrolithiasis: no  H/o  pancreatitis: no  GERD: no     Prior Surgeries:  Cholecystectomy: no  Hysterectomy: no    Birth Control Methods: OCP         Prediabetes    Previous A1C of 5.4%. Mild acanthosis nigricans on PE. Reviewed relevant medications and labs. Patient is following our Weight Management Clinic. See Obesity tab for medication changes.    Lab Results   Component Value Date    A1C 5.4 04/24/2022    A1C 4.7 (L) 04/19/2021    A1C 4.8 10/27/2020               NAFLD (nonalcoholic fatty liver disease) - Primary    Fibroscan on 03/04/23 showed F2. MRI on 06/14/22 showed normal liver size with normal liver contours. US  04/30/22 showed the liver was mildly enlarged and heterogenous in echotexture. Multiple hypoechoic lesions throughout the liver. History of hemochromatosis. FIB4: 0.9. History of elevated LFTs. Patient is followed by GI/hepatology. Reviewed relevant medications and labs. Patient is following our Weight Management Clinic. See Obesity tab for medication changes.     Lab Results   Component Value Date    AST 15 07/31/2023    AST 13 03/04/2023    AST 21 12/04/2022    ALT 14 07/31/2023    ALT 20 03/04/2023    ALT 42 12/04/2022    PLT 301 03/04/2023    PLT 280 04/24/2022    PLT 268 08/01/2021     MASLD/NAFLD Screening: (08/20/23)  Points <1.45: Cirrhosis less likely  FIB-4 Calculation: 0.39 at 03/04/2023 10:57 AM  Calculated from:  SGOT/AST: 13 U/L at 03/04/2023 10:56 AM  SGPT/ALT: 20 U/L at 03/04/2023 10:56 AM  Platelets: 301 10*9/L at 03/04/2023 10:57 AM  Age: 80 years           Dyslipidemia    Reviewed relevant medications and labs. Patient is following our Weight Management Clinic. See Obesity tab for medication changes.    Lab Results   Component Value Date    CHOL 224 (H) 04/24/2022    CHOL 199 04/19/2021    CHOL 196 01/17/2021    LDL 127 (H) 04/24/2022    LDL 122 (H) 04/19/2021    LDL 125 (H) 01/17/2021    HDL 48 04/24/2022    HDL 51 04/19/2021    HDL 48 01/17/2021    TRIG 756 (H) 04/24/2022    TRIG 130 04/19/2021    TRIG 114 01/17/2021                 Thyroid mass of unclear etiology    Palpable pea-sized mass on the left upper lobe of the thyroid gland on physical exam. Urged patient to follow up with PCP for ultrasound and further evaluation/treatment.           Return in about 4 months (around 12/21/2023) for Weight clinic F/U one slot..    I have reviewed and addressed the patient???s adherence and response to prescribed medications. I have identified patient barriers to following the proposed medication and treatment plan, and have noted opportunities to optimize healthy behaviors. I have answered the patient???s questions to satisfaction and the patient voices understanding.    Time: Greater than 50% of this encounter was spent in direct consultation with the patient in evaluation and discussing all of the above. Duration of encounter: 20 minutes.     HPI:     Tracey Randall is a 43 y.o. female who  has a past medical history of Anxiety, Diabetes mellitus,  GERD (gastroesophageal reflux disease), Mild intermittent asthma without complication (HHS-HCC) (02/09/2014), Obesity, and Thyromegaly (08/27/2023). who presents today for [facility] follow up.    Weight Management History  Wt Readings from Last 6 Encounters:   08/20/23 81 kg (178 lb 9.6 oz)   07/31/23 82.6 kg (182 lb)   04/17/23 87.9 kg (193 lb 12.8 oz)   03/04/23 94.3 kg (208 lb)   12/04/22 96.6 kg (213 lb)   08/08/22 (!) 102.3 kg (225 lb 9.6 oz)         09/06/2021     2:26 PM 12/26/2021     9:15 AM 04/24/2022     9:16 AM 08/08/2022    10:30 AM 12/04/2022     3:10 PM 04/17/2023     9:05 AM 08/20/2023     8:46 AM   Waist Circumference   Waist Circumference 43 inches 47.5 inches 46.5 inches 44.5 inches 41 inches 38 inches 35.5 inches            Update: Patient has lost 15 lbs since her last visit. Endorses no binge eating episodes. Continues to work with therapist.   Medication: Patient is taking Zepbound  7.5 mg qwk from [employer]- however, she is only taking this once every 3 weeks to save money. She is also taking metformin  500 mg BID.   Eating Pattern:  - Fast food/Restaurants/Takeout: 0-1x per week.   - Breakfast: oatmeal/grits, wheat toast, protein shake, and fruit  - Lunch: salad, lean meat, fish, veggies, and frozen meals  - Dinner: salad, wheat sandwich, soup, pork, fish, veggies, frozen meals, leftovers, fruits, and protein shake  - Drinks: water, milk, unsweetened tea  - Snacks: fruit, vegetables, granola bars, nuts, cheese, and popcorn  Physical Activity:  walking and lifting weights for 30-60 mins, 6x per week.  Barrier: None discussed.     Lab Results   Component Value Date    A1C 4.8 07/31/2023    GLU 75 07/31/2023    CHOL 176 07/31/2023    LDL 112 (H) 07/31/2023    HDL 42 (L) 07/31/2023    TRIG 137 07/31/2023    AST 15 07/31/2023    ALT 14 07/31/2023    PLT 301 03/04/2023    TSH 0.986 07/31/2023    VITDTOTAL 47.0 07/31/2023     Past Medical/Surgical History:     Past Medical History[1]  Past Surgical History[2]    Allergies:     Alpha-gal (galactose-alpha-1,3-galactose); Klonopin [clonazepam]; Lexapro [escitalopram oxalate]; and Tramadol    Social History:     Social History[3]    Family History:     Family History[4]    Medication list:     Current Medications[5]  Above medication list reviewed and updated.    ROS:     A 12 point review of systems was negative except for pertinent items noted in the HPI     Current Medications:     Current Medications[6]    I have reviewed and (if needed) updated the patient's problem list, medications, allergies, past medical and surgical history, social and family history.    Vital Signs:     Body mass index is 27.97 kg/m??. Waist Circumference: 35.5 inches    Wt Readings from Last 3 Encounters:   08/20/23 81 kg (178 lb 9.6 oz)   07/31/23 82.6 kg (182 lb)   04/17/23 87.9 kg (193 lb 12.8 oz)     Temp Readings from Last 3 Encounters:   03/04/23 36.4 ??C (97.5 ??  F)   08/08/22 36.1 ??C (96.9 ??F) (Temporal)   04/24/22 36 ??C (96.8 ??F) (Temporal)     BP Readings from Last 3 Encounters:   08/20/23 144/83   07/31/23 112/76   04/17/23 128/89     Pulse Readings from Last 3 Encounters:   08/20/23 116   07/31/23 95   04/17/23 90        Physical Exam:     General: well appearing, in NAD, Body mass index is 27.97 kg/m??. Ambulatory without help.  Body fat distribution: General adiposity. No supraclavicular adiposity. No dorsal adiposity. Waist Circumference: 35.5 inches     Head: normocephalic atraumatic.  Eyes: PERRLA, EOMI, Sclera WNL.   Neck: supple, no LAD, Presence of a pea-sized mass on the left upper lobe of the thyroid.   CV: RRR no rubs or murmurs.  Lungs: clear bilaterally to auscultation. No wheezing.  Extremities: no clubbing, cyanosis, No edema.  Skin: no concerning lesions, rashes observed. No lipomas. Mild acanthosis nigricans.   Neuro: Alert and oriented X 3.    Labs:     Office Visit on 07/31/2023   Component Date Value Ref Range Status    Sodium 07/31/2023 140  135 - 145 mmol/L Final    Potassium 07/31/2023 3.7  3.4 - 4.8 mmol/L Final    Chloride 07/31/2023 106  98 - 107 mmol/L Final    CO2 07/31/2023 24.0  20.0 - 31.0 mmol/L Final    Anion Gap 07/31/2023 10  5 - 14 mmol/L Final    BUN 07/31/2023 15  9 - 23 mg/dL Final    Creatinine 92/89/7974 0.67  0.55 - 1.02 mg/dL Final    BUN/Creatinine Ratio 07/31/2023 22   Final    eGFR CKD-EPI (2021) Female 07/31/2023 >90  >=60 mL/min/1.77m2 Final    eGFR calculated with CKD-EPI 2021 equation in accordance with [employer] and [employer] of Nephrology Task Force recommendations.    Glucose 07/31/2023 75  70 - 179 mg/dL Final    Calcium 92/89/7974 9.7  8.7 - 10.4 mg/dL Final    Albumin 92/89/7974 4.4  3.4 - 5.0 g/dL Final    Total Protein 07/31/2023 7.8  5.7 - 8.2 g/dL Final    Total Bilirubin 07/31/2023 0.8  0.3 - 1.2 mg/dL Final    AST 92/89/7974 15  <=34 U/L Final    ALT 07/31/2023 14  10 - 49 U/L Final    Alkaline Phosphatase 07/31/2023 102  46 - 116 U/L Final    Hemoglobin A1C 07/31/2023 4.8  4.8 - 5.6 % Final    Estimated Average Glucose 07/31/2023 91  mg/dL Final    Cholesterol, Total 07/31/2023 176  <200 mg/dL Final    Cholesterol, HDL 07/31/2023 42 (L)  >50 mg/dL Final    Cholesterol, LDL, Calculated 07/31/2023 112 (H)  <100 mg/dL Final    Calculated LDL-Cholesterol utilizes the 2024 modified [employer].    Cholesterol, Non-HDL, Calculated 07/31/2023 134 (H)  <130 mg/dL Final    Triglycerides 07/31/2023 137  <150 mg/dL Final    Fasting 92/89/7974 No   Final    TSH 07/31/2023 0.986  0.550 - 4.780 uIU/mL Final    Vitamin D Total (25OH) 07/31/2023 47.0  20.0 - 80.0 ng/mL Final       Follow-up:     Return in about 4 months (around 12/21/2023) for Weight clinic F/U one slot..    I attest that I, Prentice Mayer, personally documented this note for [facility], MD.  Prentice  Halcomb  08/20/2023     I attest that I have reviewed the note and that the components of the history of the present illness, the physical exam, and the assessment and plan documented were performed by me or were performed in my presence where I verified the documentation and performed (or re-performed) the exam and medical decision making.     LAURAINE JINNY HUTCH, MD         [1]   Past Medical History:  Diagnosis Date    Anxiety     Diabetes mellitus        GERD (gastroesophageal reflux disease)     Mild intermittent asthma without complication (HHS-HCC) 02/09/2014    Obesity     Thyromegaly 08/27/2023   [2] No past surgical history on file.  [3]   Social History  Socioeconomic History    Marital status: Single     Spouse name: None    Number of children: 0    Years of education: None    Highest education level: None   Occupational History     Comment: Works in Education officer, environmental at [employer]   Tobacco Use    Smoking status: Former     Current packs/day: 0.00     Types: Cigarettes     Start date: 01/22/2000     Quit date: 01/21/2001     Years since quitting: 22.6     Passive exposure: Never    Smokeless tobacco: Never   Vaping Use    Vaping status: Never Used   Substance and Sexual Activity    Alcohol use: Yes     Comment: Intermittent but less than once a month    Drug use: No    Sexual activity: Not Currently   Social History Narrative    Is from [place], [place], moved to [place] at age 39.    She is single.     No children. She has PCOS. Has tried fertility treatments.     Lives with her mother and father and helps care for them.    She went to [employer] [facility] [employer].    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.     Social Drivers of Psychologist, prison and probation services Strain: Low Risk  (08/19/2023)    Overall Financial Resource Strain (CARDIA)     Difficulty of Paying Living Expenses: Not very hard   Food Insecurity: No Food Insecurity (08/19/2023)    Hunger Vital Sign     Worried About Running Out of Food in the Last Year: Never true     Ran Out of Food in the Last Year: Never true   Transportation Needs: No Transportation Needs (08/19/2023)    PRAPARE - Therapist, art (Medical): No     Lack of Transportation (Non-Medical): No   Physical Activity: Insufficiently Active (03/15/2020)    Exercise Vital Sign     Days of Exercise per Week: 3 days     Minutes of Exercise per Session: 30 min   Housing: Low Risk  (08/19/2023)    Housing     Within the past 12 months, have you ever stayed: outside, in a car, in a tent, in an overnight shelter, or temporarily in someone else's home (i.e. couch-surfing)?: No     Are you worried about losing your housing?: No   [4]   Family History  Problem Relation Age of Onset  Hypertension Mother     Heart disease Father     Stroke Father     Hypertension Father     Glaucoma Father     Hemochromatosis Paternal Aunt     Hemochromatosis Paternal Uncle     Mental illness Neg Hx     Substance Abuse Disorder Neg Hx    [5]   Current Outpatient Medications:     albuterol  HFA 90 mcg/actuation inhaler, Inhale 2 puffs every six (6) hours as needed., Disp: 8 g, Rfl: 2    ALPRAZolam  (XANAX ) 0.25 MG tablet, Take 1 tablet (0.25 mg total) by mouth every eight (8) hours as needed for anxiety., Disp: 30 tablet, Rfl: 0    cetirizine -pseudoephedrine  (ZYRTEC -D) 5-120 mg per tablet, 1-2 daily as needed, Disp: 60 tablet, Rfl: 2    cholecalciferol, vitamin D3, 2,000 unit Tab, Take 1 tablet (50 mcg total) by mouth daily., Disp: , Rfl:     cyanocobalamin, vitamin B-12, 1000 MCG tablet, Take 1 tablet (1,000 mcg total) by mouth daily., Disp: , Rfl:     diphenhydrAMINE (BENADRYL) 25 mg capsule/tablet, Take 1 each (25 mg total) by mouth every six (6) hours as needed for itching., Disp: , Rfl:     famotidine (PEPCID) 10 MG tablet, Take 1 tablet (10 mg total) by mouth in the morning., Disp: , Rfl:     magnesium oxide (MAG-OX) 400 mg tablet, Take 1 tablet (400 mg total) by mouth daily., Disp: , Rfl:     metFORMIN  (GLUCOPHAGE ) 500 MG tablet, Take 1 tablet (500 mg total) by mouth two (2) times a day., Disp: 180 tablet, Rfl: 3    multivitamin, prenatal, folic acid-iron, 27-1 mg Tab, Take 1 tablet by mouth daily., Disp: , Rfl:     norethindrone (MICRONOR) 0.35 mg tablet, Take 1 tablet by mouth daily., Disp: , Rfl:     OMEGA-3 FATTY ACIDS/FISH OIL (OMEGA 3 FISH OIL ORAL), Take 1,200 mg by mouth daily., Disp: , Rfl:     tirzepatide , weight loss, (ZEPBOUND ) 7.5 mg/0.5 mL Soln, 7.5 mg by Continuous Sub-Q Infusn (via wearable injector) route once a week., Disp: 2 mL, Rfl: 2    vitamin E-268 mg, 400 UNIT, 268 mg (400 UNIT) capsule, Take 1 capsule (268 mg total) by mouth daily., Disp: , Rfl:   [6]   Current Outpatient Medications   Medication Sig Dispense Refill    albuterol  HFA 90 mcg/actuation inhaler Inhale 2 puffs every six (6) hours as needed. 8 g 2    ALPRAZolam  (XANAX ) 0.25 MG tablet Take 1 tablet (0.25 mg total) by mouth every eight (8) hours as needed for anxiety. 30 tablet 0    cetirizine -pseudoephedrine  (ZYRTEC -D) 5-120 mg per tablet 1-2 daily as needed 60 tablet 2    cholecalciferol, vitamin D3, 2,000 unit Tab Take 1 tablet (50 mcg total) by mouth daily.      cyanocobalamin, vitamin B-12, 1000 MCG tablet Take 1 tablet (1,000 mcg total) by mouth daily.      diphenhydrAMINE (BENADRYL) 25 mg capsule/tablet Take 1 each (25 mg total) by mouth every six (6) hours as needed for itching.      famotidine (PEPCID) 10 MG tablet Take 1 tablet (10 mg total) by mouth in the morning.      magnesium oxide (MAG-OX) 400 mg tablet Take 1 tablet (400 mg total) by mouth daily.      metFORMIN  (GLUCOPHAGE ) 500 MG tablet Take 1 tablet (500 mg total) by mouth two (2) times a day. 180  tablet 3    multivitamin, prenatal, folic acid-iron, 27-1 mg Tab Take 1 tablet by mouth daily.      norethindrone (MICRONOR) 0.35 mg tablet Take 1 tablet by mouth daily.      OMEGA-3 FATTY ACIDS/FISH OIL (OMEGA 3 FISH OIL ORAL) Take 1,200 mg by mouth daily.      tirzepatide , weight loss, (ZEPBOUND ) 7.5 mg/0.5 mL Soln 7.5 mg by Continuous Sub-Q Infusn (via wearable injector) route once a week. 2 mL 2    vitamin E-268 mg, 400 UNIT, 268 mg (400 UNIT) capsule Take 1 capsule (268 mg total) by mouth daily.       No current facility-administered medications for this visit.

## 2023-07-31 ENCOUNTER — Encounter: Admit: 2023-07-31 | Discharge: 2023-07-31 | Payer: PRIVATE HEALTH INSURANCE

## 2023-07-31 DIAGNOSIS — E6609 Other obesity due to excess calories: Principal | ICD-10-CM

## 2023-07-31 DIAGNOSIS — R7303 Prediabetes: Principal | ICD-10-CM

## 2023-07-31 DIAGNOSIS — F50819 Binge eating disorder, unspecified severity: Principal | ICD-10-CM

## 2023-07-31 DIAGNOSIS — E785 Hyperlipidemia, unspecified: Principal | ICD-10-CM

## 2023-07-31 DIAGNOSIS — Z0000 Encounter for general adult medical examination without abnormal findings: Principal | ICD-10-CM

## 2023-07-31 DIAGNOSIS — K7581 Nonalcoholic steatohepatitis (NASH): Principal | ICD-10-CM

## 2023-07-31 DIAGNOSIS — E66812 Class 2 obesity due to excess calories with body mass index (BMI) of 37.0 to 37.9 in adult, unspecified whether serious comorbidity present: Principal | ICD-10-CM

## 2023-07-31 DIAGNOSIS — E282 Polycystic ovarian syndrome: Principal | ICD-10-CM

## 2023-07-31 DIAGNOSIS — K760 Fatty (change of) liver, not elsewhere classified: Principal | ICD-10-CM

## 2023-07-31 DIAGNOSIS — Z1329 Encounter for screening for other suspected endocrine disorder: Principal | ICD-10-CM

## 2023-07-31 DIAGNOSIS — R030 Elevated blood-pressure reading, without diagnosis of hypertension: Principal | ICD-10-CM

## 2023-07-31 DIAGNOSIS — K769 Liver disease, unspecified: Principal | ICD-10-CM

## 2023-07-31 DIAGNOSIS — Z6837 Body mass index (BMI) 37.0-37.9, adult: Principal | ICD-10-CM

## 2023-07-31 DIAGNOSIS — E559 Vitamin D deficiency, unspecified: Principal | ICD-10-CM

## 2023-07-31 LAB — COMPREHENSIVE METABOLIC PANEL
ALBUMIN: 4.4 g/dL (ref 3.4–5.0)
ALKALINE PHOSPHATASE: 102 U/L (ref 46–116)
ALT (SGPT): 14 U/L (ref 10–49)
ANION GAP: 10 mmol/L (ref 5–14)
AST (SGOT): 15 U/L (ref <=34)
BILIRUBIN TOTAL: 0.8 mg/dL (ref 0.3–1.2)
BLOOD UREA NITROGEN: 15 mg/dL (ref 9–23)
BUN / CREAT RATIO: 22
CALCIUM: 9.7 mg/dL (ref 8.7–10.4)
CHLORIDE: 106 mmol/L (ref 98–107)
CO2: 24 mmol/L (ref 20.0–31.0)
CREATININE: 0.67 mg/dL (ref 0.55–1.02)
EGFR CKD-EPI (2021) FEMALE: >90 mL/min/1.73m2 (ref >=60)
GLUCOSE RANDOM: 75 mg/dL (ref 70–179)
POTASSIUM: 3.7 mmol/L (ref 3.4–4.8)
PROTEIN TOTAL: 7.8 g/dL (ref 5.7–8.2)
SODIUM: 140 mmol/L (ref 135–145)

## 2023-07-31 LAB — LIPID PANEL
CHOLESTEROL: 176 mg/dL (ref <200)
HDL CHOLESTEROL: 42 mg/dL — ABNORMAL LOW (ref >50)
LDL CHOLESTEROL CALCULATED: 112 mg/dL — ABNORMAL HIGH (ref <100)
NON-HDL CHOLESTEROL: 134 mg/dL — ABNORMAL HIGH (ref <130)
TRIGLYCERIDES: 137 mg/dL (ref <150)

## 2023-07-31 LAB — HEMOGLOBIN A1C
ESTIMATED AVERAGE GLUCOSE: 91 mg/dL
HEMOGLOBIN A1C: 4.8 % (ref 4.8–5.6)

## 2023-07-31 LAB — TSH: THYROID STIMULATING HORMONE: 0.986 u[IU]/mL (ref 0.550–4.780)

## 2023-07-31 NOTE — Unmapped (Signed)
 Assessment and Plan:     Tracey Randall was seen today for annual exam.    Diagnoses and all orders for this visit:    Encounter for annual physical exam    Assessment/Plan:     Annual Physical Examination  Annual physical examination conducted. Blood pressure is 112/76 mmHg. February labs showed normal CBC. Comprehensive metabolic panel, A1c, cholesterol, thyroid function, and vitamin D levels to be assessed today.  - Order comprehensive metabolic panel t  - Order A1c test to evaluate diabetes status  - Order cholesterol panel  - Order thyroid function test  - Order vitamin D level test    Nonalcoholic Steatohepatitis (NASH) with Hepatic Lesions  Diagnosed with NASH and hepatic lesions. Previous imaging indicated hepatic steatosis and lesions. Birth control changed due to potential liver impact. Follow-up with hepatologist planned for August with MRI to assess liver status. Weight loss efforts ongoing to improve liver condition.  - Follow up with hepatologist in August for MRI to assess liver status  - Continue weight loss efforts    Polycystic Ovary Syndrome (PCOS)  PCOS managed with metformin  500 mg twice daily. Birth control changed to Micronor due to liver concerns associated with previous birth control. Micronor chosen to avoid potential liver lesions associated with other birth control types.  - Continue metformin  500 mg twice daily  - Continue Micronor for birth control    Thyromegaly  Enlarged thyroid detected on examination. No symptoms such as dysphagia or hoarseness. Advised to consider ultrasound for further evaluation. Prefers to discuss findings with Dr. Claudie for a second opinion before proceeding with further evaluation.  - Consider thyroid ultrasound for further evaluation    Intermittent Asthma  Intermittent asthma, uses inhaler rarely, primarily during allergy season. Last use was 4-5 months ago.    General Health Maintenance  Mammogram and Pap smear are up to date. Mammogram done in January 2025 with follow-up in a year. Pap smear done in November 2024 and was negative, next due in 2029.       -     Comprehensive Metabolic Panel  -     Hemoglobin A1c  -     Lipid Panel  -     TSH  -     Vitamin D 25 Hydroxy (25OH D2 + D3)    Polycystic ovarian disease    NAFLD (nonalcoholic fatty liver disease)  -     Comprehensive Metabolic Panel    Metabolic dysfunction-associated steatohepatitis (MASH)    Liver lesion  -     Comprehensive Metabolic Panel    Binge eating disorder, unspecified severity    Class 2 obesity due to excess calories with body mass index (BMI) of 37.0 to 37.9 in adult, unspecified whether serious comorbidity present    Followed by weight management Dr Claudie    Dyslipidemia  -     Lipid Panel    Prediabetes  -     Hemoglobin A1c    Prehypertension  -     Comprehensive Metabolic Panel    Vitamin D deficiency  -     Vitamin D 25 Hydroxy (25OH D2 + D3)    Screening for thyroid disorder  -     TSH        43 y.o. female here for routine PE, doing well    Discussed importance of preventative exams including:  - monthly self breast exams  - bi annual dental exams  - annual eye exams  - annual dermatology visit  for skin check & sunscreen protection    Return in about 1 year (around 07/30/2024) for Annual physical.or sooner as needed     Health care  maintenance:  Colonoscopy - Due at age 33, no family historysooner if symptomatic   Mammogram-UTD, report reviewed, due annually  Pap- No results found for: INTERPGYN, SPECADGYN, HPVRES  - current   Screening Low Dose CT (age 66-80 yearly if 30pk year history and those who quit in past 15 years as per USPSTF)- not age 62    DEXA- Due age 25  Hep C screening (age 36-54 year old as per USPSTF guidelines)-   Lab Results   Component Value Date    HEPCAB Nonreactive 12/04/2022    UTD, results reviewed   HIV screening(as per USPSTF age 65-65)-  No results found for: HIV patient declines  Pneumovax/Prevnar- N/A ,    Tdap-UTDTdaP 11/17/2014   COVID vaccine-UTD Covid-19 Vac, (73yr+) (Spikevax) Monovalent Moderna 03/26/2019 04/16/2019   Flu Vaccine- not offered this time of year   Influenza Virus Vaccine, unspecified formulation 10/11/2020     Prior Labs:  Lab Results   Component Value Date    A1C 4.8 07/31/2023      No results found for: LIPID  Lab Results   Component Value Date    NA 140 07/31/2023    K 3.7 07/31/2023    CL 106 07/31/2023    ANIONGAP 10 07/31/2023    CO2 24.0 07/31/2023    BUN 15 07/31/2023    CREATININE 0.67 07/31/2023    BCR 22 07/31/2023    GFRNAAF >90 01/19/2020    GFRAAF >90 01/19/2020    GLU 75 07/31/2023    CALCIUM 9.7 07/31/2023    ALBUMIN 4.4 07/31/2023    PROT 7.8 07/31/2023    BILITOT 0.8 07/31/2023    AST 15 07/31/2023    ALT 14 07/31/2023    ALKPHOS 102 07/31/2023      Lab Results   Component Value Date    WBC 9.5 03/04/2023    HGB 14.4 03/04/2023    HCT 40.0 03/04/2023    MCV 84.5 03/04/2023    RDW 12.8 03/04/2023    PLT 301 03/04/2023    NEUTROPCT 63.6 08/01/2021    LYMPHOPCT 27.2 08/01/2021    MONOPCT 7.0 08/01/2021    EOSPCT 1.5 08/01/2021    BASOPCT 0.7 08/01/2021          Subjective:     Chief Complaint   Patient presents with    Annual Exam       Tracey Randall 42 y.o.female  is being seen for a complete physical exam.    History of Present Illness    Tracey Randall is a 43 year old female who presents for an annual physical exam.    She has a history of polycystic ovary syndrome (PCOS) and liver lesions. Recently, she switched her birth control to Micronor due to concerns about liver lesions potentially caused by her previous birth control.   An MRI and ultrasound indicated hepatic steatosis and liver lesions. She is scheduled for a follow-up MRI in August to assess the liver lesions further.  In terms of her liver condition, she had an abdominal ultrasound in April showing hepatic steatosis and an MRI in May revealing liver lesions. She underwent further testing. She is working on weight loss as part of her management plan.  She is followed by GI for liver and GYN for PCOS     She is on  metformin  500 mg twice daily for PCOS and takes Zepbound  7.5 mg. She previously used Topamax  but discontinued it due to tingling in her fingers.     She uses an inhaler for intermittent asthma, which she uses rarely, about a few times a year, primarily during allergy seasons.  She has a history of intermittent asthma, which is well-controlled, and she has not used her inhaler in the past four to five months.     She also reports food intolerances but no significant issues with bowel movements.    No significant issues with urination or bowel movements, and no vision or hearing problems aside from wearing glasses. She has no symptoms such as trouble swallowing or voice changes.           ROS:     Review of Systems   Constitutional:  Negative for chills, fever, malaise/fatigue and weight loss.   HENT:  Negative for congestion and hearing loss.    Eyes:  Negative for pain, discharge and redness.   Respiratory:  Negative for cough and shortness of breath.    Cardiovascular:  Negative for chest pain, palpitations and leg swelling.   Gastrointestinal:  Negative for abdominal pain, constipation, diarrhea, heartburn, nausea and vomiting.   Genitourinary:  Negative for dysuria.   Musculoskeletal:  Negative for back pain, myalgias and neck pain.   Skin:  Negative for rash.   Neurological:  Negative for dizziness, weakness and headaches.   Endo/Heme/Allergies:  Negative for polydipsia. Does not bruise/bleed easily.   Psychiatric/Behavioral:  Negative for depression. The patient is not nervous/anxious and does not have insomnia.         Vital Signs:        Visit Vitals  BP 112/76   Pulse 95   Ht 170.2 cm (5' 7.01)   Wt 82.6 kg (182 lb)   LMP 07/21/2023 (Exact Date)   SpO2 99%   BMI 28.50 kg/m??      There were no vitals filed for this visit.     Objective:     Physical Exam:  Physical Exam  Constitutional:       General: She is not in acute distress.     Appearance: Normal appearance. She is well-developed and well-groomed. She is not ill-appearing.   HENT:      Head: Normocephalic and atraumatic.      Right Ear: Hearing, tympanic membrane, ear canal and external ear normal.      Left Ear: Hearing, tympanic membrane, ear canal and external ear normal.      Nose: Nose normal.      Mouth/Throat:      Lips: Pink.      Mouth: Mucous membranes are moist.      Pharynx: Oropharynx is clear. Uvula midline. No pharyngeal swelling, oropharyngeal exudate, posterior oropharyngeal erythema or uvula swelling.      Tonsils: 0 on the right. 0 on the left.     Eyes:      General: Lids are normal. Vision grossly intact. Gaze aligned appropriately.      Extraocular Movements: Extraocular movements intact.      Conjunctiva/sclera: Conjunctivae normal.      Pupils: Pupils are equal, round, and reactive to light.     Neck:      Thyroid: Thyromegaly present. No thyroid mass or thyroid tenderness.      Vascular: No carotid bruit or JVD.      Trachea: Trachea normal.     Cardiovascular:  Rate and Rhythm: Normal rate and regular rhythm.      Pulses:           Carotid pulses are 2+ on the right side and 2+ on the left side.       Radial pulses are 2+ on the right side and 2+ on the left side.      Heart sounds: Normal heart sounds, S1 normal and S2 normal.   Pulmonary:      Effort: Pulmonary effort is normal.      Breath sounds: Normal breath sounds and air entry.   Chest:      Comments: Declines breast exam   Abdominal:      General: Bowel sounds are normal.      Palpations: Abdomen is soft.      Tenderness: There is no abdominal tenderness.      Hernia: No hernia is present. There is no hernia in the umbilical area.     Musculoskeletal:      Cervical back: Neck supple.      Right lower leg: No edema.      Left lower leg: No edema.   Lymphadenopathy:      Cervical: No cervical adenopathy.     Skin:     General: Skin is warm and dry.      Findings: No rash.     Neurological:      Mental Status: She is alert and oriented to person, place, and time.      Cranial Nerves: No cranial nerve deficit.      Deep Tendon Reflexes:      Reflex Scores:       Bicep reflexes are 2+ on the right side and 2+ on the left side.       Patellar reflexes are 2+ on the right side and 2+ on the left side.    Psychiatric:         Attention and Perception: Attention and perception normal.         Mood and Affect: Mood and affect normal.         Speech: Speech normal.         Behavior: Behavior normal. Behavior is cooperative.         Thought Content: Thought content normal.            Medication adherence and barriers to the treatment plan have been addressed. Opportunities to optimize healthy behaviors have been discussed. Patient / caregiver voiced understanding.

## 2023-08-04 LAB — VITAMIN D 25 HYDROXY: VITAMIN D, TOTAL (25OH): 47 ng/mL (ref 20.0–80.0)

## 2023-08-06 MED ORDER — ZEPBOUND 7.5 MG/0.5 ML SUBCUTANEOUS SOLUTION
0 refills | 0.00000 days
Start: 2023-08-06 — End: ?

## 2023-08-06 NOTE — Unmapped (Signed)
 Pls advise on request

## 2023-08-07 MED ORDER — ZEPBOUND 7.5 MG/0.5 ML SUBCUTANEOUS SOLUTION
SUBCUTANEOUS | 0 refills | 0.00000 days | Status: CP
Start: 2023-08-07 — End: ?

## 2023-08-12 NOTE — Unmapped (Signed)
 Good afternoon Tracey Randall     I have reviewed your labs     Your lipid panel is elevated - This places you at higher risk for heart disease and stroke - Please try to decrease fried/fatty foods/saturated fats/carbs, increase water intake, and exercise 3-4 times a week for 45-60 minutes, all with the goal of weight loss - We will recheck these labs at your next visit and discuss medication options if warranted     Otherwise, your labs look good     Verneita

## 2023-08-20 DIAGNOSIS — K760 Fatty (change of) liver, not elsewhere classified: Principal | ICD-10-CM

## 2023-08-20 DIAGNOSIS — Z6837 Body mass index (BMI) 37.0-37.9, adult: Principal | ICD-10-CM

## 2023-08-20 DIAGNOSIS — E785 Hyperlipidemia, unspecified: Principal | ICD-10-CM

## 2023-08-20 DIAGNOSIS — E66812 Class 2 obesity due to excess calories with body mass index (BMI) of 37.0 to 37.9 in adult, unspecified whether serious comorbidity present: Principal | ICD-10-CM

## 2023-08-20 DIAGNOSIS — R7303 Prediabetes: Principal | ICD-10-CM

## 2023-08-20 DIAGNOSIS — E0789 Other specified disorders of thyroid: Principal | ICD-10-CM

## 2023-08-20 DIAGNOSIS — E6609 Other obesity due to excess calories: Principal | ICD-10-CM

## 2023-08-20 MED ORDER — ZEPBOUND 7.5 MG/0.5 ML SUBCUTANEOUS SOLUTION
SUBCUTANEOUS | 2 refills | 0.00000 days | Status: CP
Start: 2023-08-20 — End: ?

## 2023-08-20 NOTE — Unmapped (Signed)
 Palpable pea-sized mass on the left upper lobe of the thyroid gland on physical exam. Urged patient to follow up with PCP for ultrasound and further evaluation/treatment.

## 2023-08-27 DIAGNOSIS — E010 Iodine-deficiency related diffuse (endemic) goiter: Principal | ICD-10-CM

## 2023-09-11 ENCOUNTER — Inpatient Hospital Stay: Admit: 2023-09-11 | Discharge: 2023-09-11 | Payer: PRIVATE HEALTH INSURANCE

## 2023-09-18 DIAGNOSIS — E041 Nontoxic single thyroid nodule: Principal | ICD-10-CM

## 2023-09-18 NOTE — Unmapped (Signed)
 Good afternoon Tracey Randall     I have reviewed your thyroid ultrasound   There were several findings: several nodules that are of no concern and require no follow up, one nodule that requires repeat ultrasound in one year and one nodule that they would like to get a sample of to further evaluate   I will place the order for the FNA and they will contact you to schedule the procedure     Verneita

## 2023-09-19 ENCOUNTER — Inpatient Hospital Stay: Admit: 2023-09-19 | Discharge: 2023-09-19 | Payer: PRIVATE HEALTH INSURANCE

## 2023-09-19 DIAGNOSIS — T7840XA Allergy, unspecified, initial encounter: Principal | ICD-10-CM

## 2023-09-19 MED ORDER — TRIAMCINOLONE ACETONIDE 0.5 % TOPICAL OINTMENT
Freq: Two times a day (BID) | TOPICAL | 0 refills | 0.00000 days | Status: CP
Start: 2023-09-19 — End: ?

## 2023-09-19 MED ORDER — PREDNISONE 20 MG TABLET
ORAL_TABLET | Freq: Every day | ORAL | 0 refills | 5.00000 days | Status: CP
Start: 2023-09-19 — End: 2023-09-24

## 2023-09-19 MED ADMIN — gadopiclenol (ELUCIREM,VUEWAY) injection 7.5 mL: 7.5 mL | INTRAVENOUS | @ 12:00:00 | Stop: 2023-09-19

## 2023-09-19 NOTE — Unmapped (Signed)
 [facility] Encounter  This medical encounter was conducted virtually using [email or web address] protocols.    Patient ID: Tracey Randall is a 43 y.o. female who presents by video interaction for evaluation.    I have identified myself to the patient and conveyed my credentials to [facility].   Patient has signed informed consent on file in medical record.    Present on Video Call: Is there someone else in the room? No..      Assessment/Plan:    Diagnoses and all orders for this visit:    Allergic reaction, initial encounter  -     predniSONE  (DELTASONE ) 20 MG tablet; Take 2 tablets (40 mg total) by mouth daily for 5 days.  -     triamcinolone  (KENALOG ) 0.5 % ointment; Apply topically two (2) times a day.    - Benadryl, Pepcid      -- Discussed the new prescription noted above, including potential side effects, drug interactions, instructions for taking the medication, and the consequences of not taking it.  -- Patient verbalized an understanding of today's assessment and recommendations, as well as the purpose of ongoing medications.    Follow-up as Needed  and Follow-up with PCP        Medication adherence and barriers to the treatment plan have been addressed. Opportunities to optimize healthy behaviors have been discussed. Patient / caregiver voiced understanding.        Subjective:     HPI  Tracey Randall is 43 y.o. and presents today in the [facility] with allergic reaction symptoms.  The PCP for this patient is Gayle, Verneita Molt, FNP. Sustained 8 yellow jacket stings. She fell trying to get away from the yellow jackets. She put Hydrocortisone on one of the stings. She did not see any allergic reaction immediately after the stings. She finished her walk and then developed itching and diffuse rash. She ihas asthma and notes a slight wheeze. Otherwise no respiratory involvement. She took Benadryl about 30 mins ago.     ROS  Review of Systems   Skin:  Positive for rash.        All other ROS per HPI.    I have reviewed the problem list, past medical history, past family history, medications, and allergies and have updated/reconciled them if needed.          Objective:   Physical Exam  As part of this Video Visit, no in-person exam was conducted.  Video interaction permitted the following observations.    General: No acute distress.   HEENT:  EOMI.  No photophobia. No conjunctival injection.    RESP: Relaxed respiratory effort. No conversational dyspnea.   SKIN: Diffuse urticarial rash involving face, neck, BUE  NEURO: Alert and oriented.  Speech fluent and sensible.  Calm affect.           The patient reports they are physically located in [place]  and is currently: at home. I conducted a audio/video visit. I spent  63m 22s on the video call with the patient. I spent an additional 5 minutes on pre- and post-visit activities on the date of service .

## 2023-09-28 DIAGNOSIS — E041 Nontoxic single thyroid nodule: Principal | ICD-10-CM

## 2023-10-01 DIAGNOSIS — K769 Liver disease, unspecified: Principal | ICD-10-CM

## 2023-10-09 ENCOUNTER — Inpatient Hospital Stay: Admit: 2023-10-09 | Discharge: 2023-10-09

## 2023-10-09 MED ADMIN — gadoxetate 2.5 mmol/10 mL (181.43 MG/ML) intravenous solution: 8.1 mL | INTRAVENOUS | @ 12:00:00 | Stop: 2023-10-09

## 2023-10-13 NOTE — Unmapped (Signed)
 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 Pt has switched from estrogen containing OCP to progestin only. Will plan to follow up with MRI in 1 year to eval small adenoma.   Reports she continues to lose weight on zepbound 

## 2023-10-20 ENCOUNTER — Inpatient Hospital Stay: Admit: 2023-10-20 | Discharge: 2023-10-20 | Payer: PRIVATE HEALTH INSURANCE

## 2023-10-26 NOTE — Unmapped (Signed)
 Results reviewed with pt and referral to Endo has already been made and scheduled

## 2023-10-30 NOTE — Unmapped (Signed)
 Endocrinology Clinic Visit Note    ASSESSMENT AND PLAN:     Tracey Randall is a 43 y.o. female with a history significant for T2DM, NAFLD, obesity,  PCOS, asthma who is seen in consultation today at the request of Verneita Joshua Sacks for evaluation of thyroid nodules.    Multinodular thyroid  Ultrasound 08/2023 as below, concerning for TR4 nodule in the greatest dimension 1.7 cm. Re-demonstrated this finding in clinic today, confirming excellent ultrasound performed by Radiology. Characteristics today of solid, hypoechoic, wider than tall, with smooth margins, and no echogenic foci.   Additionally noted spongiform nodule in left thyroid gland.  Underwent thyroid biopsy 10/20/2023 - pathology with Bethesda II, follicular nodular disease  Fine needle aspiration of thyroid nodule without findings concerning for malignancy. Additionally, the patient lacks risks factors for thyroid malignancy. False negative results may occur due to heterogeneity of nodules and continued surveillance is warranted.   PLAN  Repeat thyroid ultrasound in 1 year - ordered  If increase in size or change in characteristics, will discuss repeating biopsy.     Patient Instructions   We will follow up an ultrasound of your thyroid in one year  We will see you in one year    Return in about 1 year (around 10/30/2024).    Patient was staffed with Dr. Carter.    Georgia Jury, MD   PGY4 Fellow in Endocrinology and Metabolism  [employer]       SUBJECTIVE:     History of Present Illness:  Tracey Randall is a 43 y.o. female who is seen in consultation today at the request of Verneita Joshua Sacks for evaluation of thyroid nodules.    Endocrine Disease History:  Presented to primary care 07/31/2023 for annual physical. Found to have thyromegaly on examination. TSH ordered, within normal limits. Patient denied dysphagia or hoarseness at the visit. Thyroid ultrasound was ordered and performed, notable for 3 nodules detailed below. One nodule was found in the right mid lobe and graded as TI-RADS 4. This nodule met criteria for FNA, which was performed 10/20/2023.    No personal history of malignancy, cervical radiation, family history thyroid nodule in mother, previously was a social tobacco user from ages [age]. She denies symptoms of dysphagia, compression, or pain in the neck. She describes a recent episode of hair loss which has now resolved.     Medical History Surgical History   Past Medical History[1]   Past Surgical History[2]       Social History Family History   Social History     Tobacco Use    Smoking status: Former     Current packs/day: 0.00     Types: Cigarettes     Start date: 01/22/2000     Quit date: 01/21/2001     Years since quitting: 22.7     Passive exposure: Never    Smokeless tobacco: Never   Substance Use Topics    Alcohol use: Yes     Comment: Intermittent but less than once a month      Family History[3]       Medications   Current Medications[4]         Allergies   Allergies[5]         OBJECTIVE:     Physical Exam:  BP 125/97 (BP Site: L Arm, BP Position: Sitting, BP Cuff Size: Large)  - Pulse 97  - Wt 81.5 kg (179 lb 9.6 oz)  - BMI 28.12 kg/m??  General: female in no apparent distress  HEENT: EOMI, sclera anicteric, right and left thyroid nodules palpated, without thyromegaly  Respiratory: No increased work of breathing on RA  Neuro: Awake, alert, and oriented  Psych: Normal mood and affect  Skin: No abnormal skin pigmentation    Labs:  I personally reviewed labs available in Epic prior to the start of today's visit.     Latest Reference Range & Units 04/19/21 08:33 04/24/22 08:35 07/31/23 11:21   TSH 0.550 - 4.780 uIU/mL 1.812 1.373 0.986       Imaging:  I personally reviewed imaging available in Epic prior to the start of today's visit.    Thyroid ultrasound 09/11/2023  FINDINGS:   Thyroid size:        Right thyroid: 5.9 x 1.6 x 1.6 cm        Left thyroid: 4.9 x 1.7 x 1.7 cm        Isthmus: 0.6 cm      Thyroid echotexture: Heterogeneous and multinodular thyroid. There are a few sub-5 mm thyroid nodules, which do not meet size criteria.      Nodule: 1   Size: 1.7 x 1.6 x 1.2 cm   Location: Right Mid/Inferior   Composition: Solid or almost completely solid (2)   Echogenicity: Hypoechoic (2)   Shape: Not taller-than-wide (0)   Margin: Smooth (0)   Echogenic foci: None (0)   Additional echogenic foci: None (0)      ACR TI-RADS total points: 4   ACR TI-RADS risk category:  TI-RADS 4   ACR TI-RADS recommendation: Ultrasound-guided FNA      Nodule: 2   Size: 1.0 x 0.8 x 0.5 cm   Location: Right Upper   Composition: Solid or almost completely solid (2)   Echogenicity: Hypoechoic (2)   Shape: Not taller-than-wide (0)   Margin: Smooth (0)   Echogenic foci: None (0)   Additional echogenic foci: None (0)      ACR TI-RADS total points: 4   ACR TI-RADS risk category:  TI-RADS 4   ACR TI-RADS recommendation: Follow up ultrasound in 1 year      Nodule: 3   Size: 1.8 x 1.5 x 1.1 cm   Location: Left Mid/Inferior   Composition: Spongiform (0)   Echogenicity: No points applied due to spongiform composition (0)   Shape: No points applied due to spongiform composition (0)   Margin: No points applied due to spongiform composition (0)   Echogenic foci: None (0)   Additional echogenic foci: None (0)      ACR TI-RADS total points: 0   ACR TI-RADS risk category:  TI-RADS 1   ACR TI-RADS recommendation: No follow up ultrasound or ultrasound-guided FNA needed     Nodule 1 (R mid):      Point of care ultrasound 10/31/2023            I reviewed and summarized (above) records in preparation for today's visit all pertinent notes in Epic/Media and CareEverywhere as well as any sent records.           [1]   Past Medical History:  Diagnosis Date    Anxiety     Diabetes mellitus    (CMS-HCC)     GERD (gastroesophageal reflux disease)     Mild intermittent asthma without complication (HHS-HCC) 02/09/2014    Obesity     Thyromegaly 08/27/2023   [2] No past surgical history on file.  [3]   Family History  Problem Relation  Age of Onset    Hypertension Mother     Heart disease Father     Stroke Father     Hypertension Father     Glaucoma Father     Hemochromatosis Paternal Aunt     Hemochromatosis Paternal Uncle     Mental illness Neg Hx     Substance Abuse Disorder Neg Hx    [4]   Current Outpatient Medications:     albuterol  HFA 90 mcg/actuation inhaler, Inhale 2 puffs every six (6) hours as needed., Disp: 8 g, Rfl: 2    ALPRAZolam  (XANAX ) 0.25 MG tablet, Take 1 tablet (0.25 mg total) by mouth every eight (8) hours as needed for anxiety., Disp: 30 tablet, Rfl: 0    cetirizine -pseudoephedrine  (ZYRTEC -D) 5-120 mg per tablet, 1-2 daily as needed, Disp: 60 tablet, Rfl: 2    cholecalciferol, vitamin D3, 2,000 unit Tab, Take 1 tablet (50 mcg total) by mouth daily., Disp: , Rfl:     cyanocobalamin, vitamin B-12, 1000 MCG tablet, Take 1 tablet (1,000 mcg total) by mouth daily., Disp: , Rfl:     diphenhydrAMINE (BENADRYL) 25 mg capsule/tablet, Take 1 each (25 mg total) by mouth every six (6) hours as needed for itching., Disp: , Rfl:     famotidine (PEPCID) 10 MG tablet, Take 1 tablet (10 mg total) by mouth in the morning., Disp: , Rfl:     magnesium oxide (MAG-OX) 400 mg tablet, Take 1 tablet (400 mg total) by mouth daily., Disp: , Rfl:     metFORMIN  (GLUCOPHAGE ) 500 MG tablet, Take 1 tablet (500 mg total) by mouth two (2) times a day., Disp: 180 tablet, Rfl: 3    multivitamin, prenatal, folic acid-iron, 27-1 mg Tab, Take 1 tablet by mouth daily., Disp: , Rfl:     norethindrone (MICRONOR) 0.35 mg tablet, Take 1 tablet by mouth daily., Disp: , Rfl:     OMEGA-3 FATTY ACIDS/FISH OIL (OMEGA 3 FISH OIL ORAL), Take 1,200 mg by mouth daily., Disp: , Rfl:     tirzepatide , weight loss, (ZEPBOUND ) 7.5 mg/0.5 mL Soln, 7.5 mg by Continuous Sub-Q Infusn (via wearable injector) route once a week., Disp: 2 mL, Rfl: 2    triamcinolone  (KENALOG ) 0.5 % ointment, Apply topically two (2) times a day., Disp: 45 g, Rfl: 0    vitamin E-268 mg, 400 UNIT, 268 mg (400 UNIT) capsule, Take 1 capsule (268 mg total) by mouth daily., Disp: , Rfl:   [5]   Allergies  Allergen Reactions    Alpha-Gal (Galactose-Alpha-1,3-Galactose)     Klonopin [Clonazepam]      Suicidal thoughts    Lexapro [Escitalopram Oxalate]      Suicidal thoughts    Tramadol Itching and Other (See Comments)     Also blacked out

## 2023-10-31 ENCOUNTER — Ambulatory Visit
Admit: 2023-10-31 | Discharge: 2023-11-01 | Payer: PRIVATE HEALTH INSURANCE | Attending: "Endocrinology | Primary: "Endocrinology

## 2023-10-31 DIAGNOSIS — E041 Nontoxic single thyroid nodule: Principal | ICD-10-CM

## 2023-10-31 NOTE — Unmapped (Signed)
 We will follow up an ultrasound of your thyroid in one year  We will see you in one year

## 2023-10-31 NOTE — Unmapped (Signed)
 I saw and evaluated the patient, participating in the key portions of the service.  I reviewed the fellow's note.  I agree with the fellow's findings and plan. Thompson Grayer, MD

## 2023-11-23 DIAGNOSIS — E282 Polycystic ovarian syndrome: Principal | ICD-10-CM

## 2023-11-23 MED ORDER — METFORMIN 500 MG TABLET
ORAL_TABLET | Freq: Two times a day (BID) | ORAL | 3 refills | 0.00000 days
Start: 2023-11-23 — End: ?

## 2023-11-24 MED ORDER — METFORMIN 500 MG TABLET
ORAL_TABLET | Freq: Two times a day (BID) | ORAL | 3 refills | 90.00000 days | Status: CP
Start: 2023-11-24 — End: ?

## 2023-11-24 NOTE — Telephone Encounter (Signed)
 Patient is requesting the following refill  Requested Prescriptions     Pending Prescriptions Disp Refills    metFORMIN  (GLUCOPHAGE ) 500 MG tablet [Pharmacy Med Name: METFORMIN  500MG  TABLETS] 180 tablet 3     Sig: TAKE 1 TABLET(500 MG) BY MOUTH TWICE DAILY       Recent Visits  Date Type Provider Dept   07/31/23 Office Visit Gayle Verneita Molt, FNP [facility] [address] [facility]   Showing recent visits within past 365 days and meeting all other requirements  Future Appointments  Date Type Provider Dept   08/03/24 Appointment Gayle Verneita Molt, FNP [facility] [address] [facility]   Showing future appointments within next 365 days and meeting all other requirements       Labs: A1c:   Hemoglobin A1C (%)   Date Value   07/31/2023 4.8   11/02/2019 5.3

## 2023-12-23 DIAGNOSIS — Z6837 Body mass index (BMI) 37.0-37.9, adult: Principal | ICD-10-CM

## 2023-12-23 DIAGNOSIS — E6609 Other obesity due to excess calories: Principal | ICD-10-CM

## 2023-12-23 DIAGNOSIS — K7581 Nonalcoholic steatohepatitis (NASH): Principal | ICD-10-CM

## 2023-12-23 DIAGNOSIS — F5081 Mild binge-eating disorder: Principal | ICD-10-CM

## 2023-12-23 DIAGNOSIS — E66812 Class 2 obesity due to excess calories without serious comorbidity with body mass index (BMI) of 37.0 to 37.9 in adult: Principal | ICD-10-CM

## 2023-12-23 NOTE — Assessment & Plan Note (Addendum)
 Tracey Randall is a 43 y.o. female with Class 2 obesity due to stress induced weight gain from bullying during high school. Patient's weight has been yoyo-ing 160-250 lb during her 20's.     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     Weight Summary:  Starting weight/BMI/WC: 250 lb, 5'7--BMI 39.1 (06/02/18, self reported), WC 44 (08/18/18, 1st IP visit)  Today's weight/BMI: 79.7 kg (175 lb 12.8 oz), BMI 27.53 (12/23/2023)  Today's Waist Circumference: 35.8 inches (12/23/2023) <= 44 (03/14/20)    Referred by: Oneil Cruise, MD in [facility].    GOALS: 1) Incorporate resistance training into physical activity regimen 30 mins 2x weekly. Patient has a photographer and weights at home. 2) Follow up with PCP re: thyroid nodule. 3) Continue to se therapist regularly 4) Continue with healthy eating pattern and physical activity regimen!      I have reviewed the patient's medical history, lifestyle history and labs/tests.   My recommendations include the following:    Lifestyle Pattern Summary: Continue to follow closely with therapist for anxiety and binge eating disorder. Patient notes no recent episodes of binge eating- states this has been helped a lot by Zepbound . See GOALS above.     Medication: Patient is taking Zepbound  7.5 mg qwk from [employer]- however, she is only taking this once every 3 weeks to save money. She is also taking metformin  500 mg BID. Discussed and decided to continue current regimen.     CONTINUE: metformin  500 mg BID   CONTINUE: Zepbound  7.5 mg q3wks ([employer])    Tried:  - Metformin : taking 500 mg BID for PCOS; no weight loss.  - Topamax : started on 06/02/18 at 250 lb. Stopped due to persistent tingling in fingers.  - Wegovy --previous good WL, difficulty with insurance & supply shortages  Contraindicated:  - phentermine/Wellbutrin: not good options due to elevated anxiety  - Zepbound : [employer]    Stress: Patient is working with a therapist on BED. Binge eating is anxiety driven and she is working on coping mechanisms. Referral sent for appetite awareness program. Patient will look into this program. (12/23/23).     Obesity Surgery: Discussed benefits of surgical treatment for obesity. Patient is nervous about this option d/t a cousin who had adverse outcomes. Referral sent 05/16/22 and patient established with Bariatric team. Patient decided not to pursue to option at this time. Will continue to follow (12/23/23)    MASLD/NAFLD Screening: (12/23/23)  Points <1.45: Cirrhosis less likely  FIB-4 Calculation: 0.39 at 03/04/2023 10:57 AM  Calculated from:  SGOT/AST: 13 U/L at 03/04/2023 10:56 AM  SGPT/ALT: 20 U/L at 03/04/2023 10:56 AM  Platelets: 301 10*9/L at 03/04/2023 10:57 AM  Age: 40 years    Medical conditions:  Glaucoma: no  Seizures: no  Medullary thyroid cancer (personal or family history): no  Multiple Endocrine Neoplasia: no  Palpitations/Tachycardia: no  Chest Pain: no past history of MI.   Headaches/Migraines: no  Nephrolithiasis: no  H/o pancreatitis: no  GERD: no     Prior Surgeries:  Cholecystectomy: no  Hysterectomy: no    Birth Control Methods: OCP

## 2023-12-23 NOTE — Assessment & Plan Note (Addendum)
 Pt follows with psych and therapy for BED.

## 2023-12-23 NOTE — Progress Notes (Signed)
 [facility] Follow Up    Assessment/Plan:     Chief Complaint   Patient presents with    Weight Management     Assessment & Plan  Mild binge-eating disorder  Pt follows with psych and therapy for BED.          Class 2 obesity due to excess calories without serious comorbidity with body mass index (BMI) of 37.0 to 37.9 in adult  Tracey Randall is a 43 y.o. female with Class 2 obesity due to stress induced weight gain from bullying during high school. Patient's weight has been yoyo-ing 160-250 lb during her 20's.     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     Weight Summary:  Starting weight/BMI/WC: 250 lb, 5'7--BMI 39.1 (06/02/18, self reported), WC 44 (08/18/18, 1st IP visit)  Today's weight/BMI: 79.7 kg (175 lb 12.8 oz), BMI 27.53 (12/23/2023)  Today's Waist Circumference: 35.8 inches (12/23/2023) <= 44 (03/14/20)    Referred by: Oneil Cruise, MD in [facility].    GOALS: 1) Incorporate resistance training into physical activity regimen 30 mins 2x weekly. Patient has a photographer and weights at home. 2) Follow up with PCP re: thyroid nodule. 3) Continue to se therapist regularly 4) Continue with healthy eating pattern and physical activity regimen!      I have reviewed the patient's medical history, lifestyle history and labs/tests.   My recommendations include the following:    Lifestyle Pattern Summary: Continue to follow closely with therapist for anxiety and binge eating disorder. Patient notes no recent episodes of binge eating- states this has been helped a lot by Zepbound . See GOALS above.     Medication: Patient is taking Zepbound  7.5 mg qwk from [employer]- however, she is only taking this once every 3 weeks to save money. She is also taking metformin  500 mg BID. Discussed and decided to continue current regimen.     CONTINUE: metformin  500 mg BID CONTINUE: Zepbound  7.5 mg q3wks ([employer])    Tried:  - Metformin : taking 500 mg BID for PCOS; no weight loss.  - Topamax : started on 06/02/18 at 250 lb. Stopped due to persistent tingling in fingers.  - Wegovy --previous good WL, difficulty with insurance & supply shortages  Contraindicated:  - phentermine/Wellbutrin: not good options due to elevated anxiety  - Zepbound : [employer]    Stress: Patient is working with a therapist on BED. Binge eating is anxiety driven and she is working on coping mechanisms. Referral sent for appetite awareness program. Patient will look into this program. (12/23/23).     Obesity Surgery: Discussed benefits of surgical treatment for obesity. Patient is nervous about this option d/t a cousin who had adverse outcomes. Referral sent 05/16/22 and patient established with Bariatric team. Patient decided not to pursue to option at this time. Will continue to follow (12/23/23)    MASLD/NAFLD Screening: (12/23/23)  Points <1.45: Cirrhosis less likely  FIB-4 Calculation: 0.39 at 03/04/2023 10:57 AM  Calculated from:  SGOT/AST: 13 U/L at 03/04/2023 10:56 AM  SGPT/ALT: 20 U/L at 03/04/2023 10:56 AM  Platelets: 301 10*9/L at 03/04/2023 10:57 AM  Age: 30 years    Medical conditions:  Glaucoma: no  Seizures: no  Medullary thyroid cancer (personal or family history): no  Multiple Endocrine Neoplasia: no  Palpitations/Tachycardia: no  Chest Pain: no past history of MI.   Headaches/Migraines: no  Nephrolithiasis: no  H/o pancreatitis: no  GERD: no     Prior Surgeries:  Cholecystectomy: no  Hysterectomy: no    Birth Control Methods: OCP         Metabolic dysfunction-associated steatohepatitis (MASH)  Fibroscan on 03/04/23 showed F2. MRI on 06/14/22 showed normal liver size with normal liver contours. US  04/30/22 showed the liver was mildly enlarged and heterogenous in echotexture. Multiple hypoechoic lesions throughout the liver. History of hemochromatosis. FIB4: 0.9. History of elevated LFTs. Patient is followed by GI/hepatology. Reviewed relevant medications and labs. Patient is following our Weight Management Clinic. See Obesity tab for medication changes.             Assessment & Plan  Class 2 obesity due to excess calories  Significant weight loss achieved with current regimen. BMI reduced to 27. Weight loss attributed to medication, dietary changes, and therapy. Financial constraints managed by adjusting medication frequency. Current regimen includes Tirzepatide  7.5 mg every three weeks, which is effective and affordable. She is considering increasing the dose to 10 mg every three weeks but will finish current supply first.  - Continue Tirzepatide  7.5 mg every three weeks.  - Encouraged healthy eating habits with 20-30 grams of protein three times a day.  - Encouraged resistance training and avoidance of sugar drinks.  - Will reassess medication frequency and dosage after current supply is finished.    Binge eating disorder, mild  Improvement in binge eating tendencies with current treatment. Medication and therapy have positively impacted eating habits. No current use of Vyvanse  or similar medications.  - Continue current therapy and medication regimen.    Nonalcoholic steatohepatitis (NASH)  Improvement in liver health with weight loss and medication. Recent MRI and thyroid evaluations show no cancer and improved liver condition. Weight loss and change in birth control are contributing to improved health.  - Continue current management and follow-up with liver specialist as needed.    Prediabetes  Managed with Metformin  and lifestyle changes. Weight loss and dietary modifications are contributing to improved glycemic control.  - Continue Metformin  500 mg twice daily.  - Maintain current dietary and exercise regimen.    Dyslipidemia  Significant improvement in triglyceride levels from 334 to 137. Weight loss and medication are contributing to improved lipid profile.  - Continue current medication and lifestyle modifications.     Return in about 4 months (around 04/22/2024) for Weight clinic F/U one slot..    I have reviewed and addressed the patient???s adherence and response to prescribed medications. I have identified patient barriers to following the proposed medication and treatment plan, and have noted opportunities to optimize healthy behaviors. I have answered the patient???s questions to satisfaction and the patient voices understanding.    Time: Greater than 50% of this encounter was spent in direct consultation with the patient in evaluation and discussing all of the above. Duration of encounter: 20 minutes.     HPI:     Tracey Randall is a 43 y.o. female who  has a past medical history of Anxiety, Diabetes mellitus (CMS-HCC), GERD (gastroesophageal reflux disease), Mild intermittent asthma without complication (HHS-HCC) (02/09/2014), Obesity, and Thyromegaly (08/27/2023). who presents today for [facility] follow up.    History of Present Illness  Tracey Randall is a 43 year old female who presents for follow-up on weight management.    Obesity and weight reduction  - Weight decreased from 257 pounds to 175 pounds  - Waist circumference reduced from 47 inches to 35  inches  - Continued weight loss despite recent seasonal depression and birthday-related 'slump'  - Triglyceride levels improved from 334 to 137  - No evidence of cancer on recent MRI and thyroid evaluations  - Improved liver function on recent imaging and laboratory studies    Pharmacologic therapy for weight management  - Currently self-administering 7.5 mg of weight management medication every three weeks using syringes from [employer] out of pocket for medication, with recent price reduction to $449  - Maintains a stock of medication and plans to continue current regimen until supply is depleted  - Also taking metformin  as part of her regimen  - Recent change in birth control, believed to contribute to improved liver health along with weight loss    Dietary and lifestyle modifications  - Adheres to prescribed eating habits without deviation  - Consumes two to three meals per day, with protein shakes when not very hungry  - Drinks water exclusively, avoids sugary beverages  - Eats healthy snacks such as nuts and Greek yogurt  - Incorporates resistance training into exercise routine    Mood and psychological factors  - Experienced a 'slump' attributed to seasonal depression and birthday, but maintained weight loss trajectory  - Feels capable of recognizing and controlling cravings  - Acknowledges lifelong challenge of weight management       Weight Management History  Wt Readings from Last 6 Encounters:   12/23/23 79.7 kg (175 lb 12.8 oz)   10/31/23 81.5 kg (179 lb 9.6 oz)   08/20/23 81 kg (178 lb 9.6 oz)   07/31/23 82.6 kg (182 lb)   04/17/23 87.9 kg (193 lb 12.8 oz)   03/04/23 94.3 kg (208 lb)         12/26/2021     9:15 AM 04/24/2022     9:16 AM 08/08/2022    10:30 AM 12/04/2022     3:10 PM 04/17/2023     9:05 AM 08/20/2023     8:46 AM 12/23/2023    10:09 AM   Waist Circumference   Waist Circumference 47.5 inches 46.5 inches 44.5 inches 41 inches 38 inches 35.5 inches 35.8 inches           Lab Results   Component Value Date    A1C 4.8 07/31/2023    GLU 75 07/31/2023    CHOL 176 07/31/2023    LDL 112 (H) 07/31/2023    HDL 42 (L) 07/31/2023    TRIG 137 07/31/2023    AST 15 07/31/2023    ALT 14 07/31/2023    PLT 301 03/04/2023    TSH 0.986 07/31/2023    VITDTOTAL 47.0 07/31/2023     Past Medical/Surgical History:     Past Medical History[1]  Past Surgical History[2]    Allergies:     Alpha-gal (galactose-alpha-1,3-galactose); Klonopin [clonazepam]; Lexapro [escitalopram oxalate]; and Tramadol    Social History:     Social History[3]    Family History:     Family History[4]    Medication list:     Current Medications[5]  Above medication list reviewed and updated.    ROS:     A 12 point review of systems was negative except for pertinent items noted in the HPI     Current Medications:     Current Medications[6]    I have reviewed and (if needed) updated the patient's problem list, medications, allergies, past medical and surgical history, social and family history.    Vital Signs:  Body mass index is 27.53 kg/m??. Waist Circumference: 35.8 inches    Wt Readings from Last 3 Encounters:   12/23/23 79.7 kg (175 lb 12.8 oz)   10/31/23 81.5 kg (179 lb 9.6 oz)   08/20/23 81 kg (178 lb 9.6 oz)     Temp Readings from Last 3 Encounters:   03/04/23 36.4 ??C (97.5 ??F)   08/08/22 36.1 ??C (96.9 ??F) (Temporal)   04/24/22 36 ??C (96.8 ??F) (Temporal)     BP Readings from Last 3 Encounters:   12/23/23 115/68   10/31/23 125/97   08/20/23 144/83     Pulse Readings from Last 3 Encounters:   12/23/23 105   10/31/23 97   08/20/23 116        Physical Exam:     General: well appearing, in NAD, Body mass index is 27.53 kg/m??. Ambulatory without help.  Body fat distribution: General adiposity. No supraclavicular adiposity. No dorsal adiposity. Waist Circumference: 35.8 inches     Head: normocephalic atraumatic.  Eyes: PERRLA, EOMI, Sclera WNL.   Neck: supple, no LAD, no thyromegaly.  CV: RRR no rubs or murmurs.  Lungs: clear bilaterally to auscultation. No wheezing.  Extremities: no clubbing, cyanosis, No edema.  Skin: no concerning lesions, rashes observed. No lipomas  Neuro: Alert and oriented X 3.    Labs:     No visits with results within 1 Month(s) from this visit.   Latest known visit with results is:   Hospital Outpatient Visit on 10/20/2023   Component Date Value Ref Range Status    Diagnosis 10/20/2023    Final                    Value:A.  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    Reference: Hildegard SZ and Calleen PA, editors. The Bethesda System for Reporting Thyroid Cytopathology: definitions, criteria and explanatory notes. [employer], [place] , 2023.         Specimen Adequacy 10/20/2023 Satisfactory for evaluation   Final    Clinical History 10/20/2023    Final                    Value:       Gross Description 10/20/2023    Final                    Value:A: Right Inferior Thyroid, fine needle aspiration    Received in the cytology laboratory are 2 ethanol-fixed Paps, 2 air-dried Diff-Quick slides;  8mL hazy reddish-brown colored fluid, unfixed    One afirma vial received     Materials Prepared & Examined:    Smear Slides???..4  Monolayers??????.0  Cytospins?????????0  Cell Blocks???.???.0  Core Biopsy???....0  Touch Prep???.???0      Preliminary Cytologic Evaluation:  A:  Right Inferior Thyroid, fine needle aspiration       -  Passes 1-2: Adequate        Performed by:  Dr. Jama  Preliminary evaluation by:  Rock Lander  Reported to: Dr. Jama on 10/20/2023 at 10:40am.         Microscopic Description 10/20/2023    Final  Value:Microscopic examination substantiates the above diagnosis.      Disclaimer 10/20/2023    Final                    Value:Unless otherwise specified, specimens are preserved using 10% neutral buffered formalin. For cases in which immunohistochemical and/or in-situ hybridization stains are performed, the following statement applies: Appropriate controls for each stain (positive controls with or without negative controls) have been evaluated and stain as expected. These stains have not been separately validated for use on decalcified specimens and should be interpreted with caution in that setting. Some of the reagents used for these stains may be classified as analyte specific reagents (ASR). Tests using ASRs were developed, and their performance characteristics were determined, by the Anatomic Pathology Department [facility]). They have not been cleared or approved by the [place]  Food and Drug Administration (FDA). The FDA does not require these tests to go through premarket FDA review. These tests are used for clinical purposes. They should not be regarded as investigational or for                           research. This laboratory is certified under the Clinical Laboratory Improvement Amendments (CLIA) as qualified to perform high complexity clinical laboratory testing.         Follow-up:     Return in about 4 months (around 04/22/2024) for Weight clinic F/U one slot..    I attest that I, Tracey Randall, personally documented this note for [facility], MD.      Tracey Randall  12/23/2023     I attest that I have reviewed the note and that the components of the history of the present illness, the physical exam, and the assessment and plan documented were performed by me or were performed in my presence where I verified the documentation and performed (or re-performed) the exam and medical decision making.     Tracey JINNY HUTCH, MD         [1]   Past Medical History:  Diagnosis Date    Anxiety     Diabetes mellitus (CMS-HCC)     GERD (gastroesophageal reflux disease)     Mild intermittent asthma without complication (HHS-HCC) 02/09/2014    Obesity     Thyromegaly 08/27/2023   [2] No past surgical history on file.  [3]   Social History  Socioeconomic History    Marital status: Single     Spouse name: None    Number of children: 0    Years of education: None    Highest education level: None   Occupational History     Comment: Works in education officer, environmental at [employer]   Tobacco Use    Smoking status: Former     Current packs/day: 0.00     Types: Cigarettes     Start date: 01/22/2000     Quit date: 01/21/2001     Years since quitting: 22.9     Passive exposure: Never    Smokeless tobacco: Never   Vaping Use    Vaping status: Never Used   Substance and Sexual Activity    Alcohol use: Yes     Comment: Intermittent but less than once a month    Drug use: No    Sexual activity: Not Currently   Social History Narrative    Is from [place], [place], moved to [place] at age 89.  She is single.     No children. She has PCOS. Has tried fertility treatments.     Lives with her mother and father and helps care for them.    She went to [facility].    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.     Social Drivers of Health     Food Insecurity: No Food Insecurity (12/01/2023)    Received from [facility]    Hunger Vital Sign     Within the past 12 months, you worried that your food would run out before you got the money to buy more.: Never true     Within the past 12 months, the food you bought just didn't last and you didn't have money to get more.: Never true   Tobacco Use: Medium Risk (12/23/2023)    Patient History     Smoking Tobacco Use: Former     Smokeless Tobacco Use: Never     Passive Exposure: Never   Transportation Needs: No Transportation Needs (12/01/2023)    Received from [facility]    PRAPARE - Transportation     In the past 12 months, has lack of transportation kept you from medical appointments or from getting medications?: No     Lack of Transportation (Non-Medical): No   Alcohol Use: Not At Risk (04/24/2022)    Alcohol Use     How often do you have a drink containing alcohol?: Monthly or less     How many drinks containing alcohol do you have on a typical day when you are drinking?: 1 - 2     How often do you have 5 or more drinks on one occasion?: Never   Housing: Unknown (12/01/2023)    [facility]    Housing Stability Vital Sign     In the last 12 months, was there a time when you were not able to pay the mortgage or rent on time?: No     At any time in the past 12 months, were you homeless or living in a shelter (including now)?: No   Utilities: Not At Risk (12/01/2023)    Received from [facility] Utilities     In the past 12 months has the electric, gas, oil, or water company threatened to shut off services in your home?: No   Interpersonal Safety: Not At Risk (08/20/2023)    Interpersonal Safety     Unsafe Where You Currently Live: No     Physically Hurt by Anyone: No     Abused by Anyone: No   Financial Resource Strain: Low Risk  (12/01/2023)    Received from [facility]    Overall Financial Resource Strain (CARDIA)     Difficulty of Paying Living Expenses: Not very hard   Internet Connectivity: Internet connectivity concern unknown (08/19/2023)    Internet Connectivity     Do you have access to internet services: Yes   [4]   Family History  Problem Relation Age of Onset    Hypertension Mother     Heart disease Father     Stroke Father     Hypertension Father     Glaucoma Father     Hemochromatosis Paternal Aunt     Hemochromatosis Paternal Uncle     Mental illness Neg Hx     Substance Abuse Disorder Neg Hx    [5]  Current Outpatient Medications:     albuterol  HFA 90 mcg/actuation inhaler, Inhale 2 puffs every six (6) hours as needed., Disp: 8 g, Rfl: 2    ALPRAZolam  (XANAX ) 0.25 MG tablet, Take 1 tablet (0.25 mg total) by mouth every eight (8) hours as needed for anxiety., Disp: 30 tablet, Rfl: 0    cetirizine -pseudoephedrine  (ZYRTEC -D) 5-120 mg per tablet, 1-2 daily as needed, Disp: 60 tablet, Rfl: 2    cholecalciferol, vitamin D3, 2,000 unit Tab, Take 1 tablet (50 mcg total) by mouth daily., Disp: , Rfl:     cyanocobalamin, vitamin B-12, 1000 MCG tablet, Take 1 tablet (1,000 mcg total) by mouth daily., Disp: , Rfl:     diphenhydrAMINE (BENADRYL) 25 mg capsule/tablet, Take 1 each (25 mg total) by mouth every six (6) hours as needed for itching., Disp: , Rfl:     famotidine (PEPCID) 10 MG tablet, Take 1 tablet (10 mg total) by mouth in the morning., Disp: , Rfl:     magnesium oxide (MAG-OX) 400 mg tablet, Take 1 tablet (400 mg total) by mouth daily., Disp: , Rfl:     metFORMIN  (GLUCOPHAGE ) 500 MG tablet, TAKE 1 TABLET(500 MG) BY MOUTH TWICE DAILY, Disp: 180 tablet, Rfl: 3    multivitamin, prenatal, folic acid-iron, 27-1 mg Tab, Take 1 tablet by mouth daily., Disp: , Rfl:     norethindrone (MICRONOR) 0.35 mg tablet, Take 1 tablet by mouth daily., Disp: , Rfl:     OMEGA-3 FATTY ACIDS/FISH OIL (OMEGA 3 FISH OIL ORAL), Take 1,200 mg by mouth daily., Disp: , Rfl:     tirzepatide , weight loss, (ZEPBOUND ) 7.5 mg/0.5 mL Soln, 7.5 mg by Continuous Sub-Q Infusn (via wearable injector) route once a week., Disp: 2 mL, Rfl: 2    triamcinolone  (KENALOG ) 0.5 % ointment, Apply topically two (2) times a day., Disp: 45 g, Rfl: 0    vitamin E-268 mg, 400 UNIT, 268 mg (400 UNIT) capsule, Take 1 capsule (268 mg total) by mouth daily., Disp: , Rfl:   [6]   Current Outpatient Medications   Medication Sig Dispense Refill    albuterol  HFA 90 mcg/actuation inhaler Inhale 2 puffs every six (6) hours as needed. 8 g 2    ALPRAZolam  (XANAX ) 0.25 MG tablet Take 1 tablet (0.25 mg total) by mouth every eight (8) hours as needed for anxiety. 30 tablet 0    cetirizine -pseudoephedrine  (ZYRTEC -D) 5-120 mg per tablet 1-2 daily as needed 60 tablet 2    cholecalciferol, vitamin D3, 2,000 unit Tab Take 1 tablet (50 mcg total) by mouth daily.      cyanocobalamin, vitamin B-12, 1000 MCG tablet Take 1 tablet (1,000 mcg total) by mouth daily.      diphenhydrAMINE (BENADRYL) 25 mg capsule/tablet Take 1 each (25 mg total) by mouth every six (6) hours as needed for itching.      famotidine (PEPCID) 10 MG tablet Take 1 tablet (10 mg total) by mouth in the morning.      magnesium oxide (MAG-OX) 400 mg tablet Take 1 tablet (400 mg total) by mouth daily.      metFORMIN  (GLUCOPHAGE ) 500 MG tablet TAKE 1 TABLET(500 MG) BY MOUTH TWICE DAILY 180 tablet 3    multivitamin, prenatal, folic acid-iron, 27-1 mg Tab Take 1 tablet by mouth daily.      norethindrone (MICRONOR) 0.35 mg tablet Take 1 tablet by mouth daily.      OMEGA-3 FATTY ACIDS/FISH OIL (OMEGA 3 FISH OIL ORAL) Take 1,200 mg by mouth daily.  tirzepatide , weight loss, (ZEPBOUND ) 7.5 mg/0.5 mL Soln 7.5 mg by Continuous Sub-Q Infusn (via wearable injector) route once a week. 2 mL 2    triamcinolone  (KENALOG ) 0.5 % ointment Apply topically two (2) times a day. 45 g 0    vitamin E-268 mg, 400 UNIT, 268 mg (400 UNIT) capsule Take 1 capsule (268 mg total) by mouth daily.       No current facility-administered medications for this visit.

## 2023-12-23 NOTE — Assessment & Plan Note (Addendum)
 Fibroscan on 03/04/23 showed F2. MRI on 06/14/22 showed normal liver size with normal liver contours. US  04/30/22 showed the liver was mildly enlarged and heterogenous in echotexture. Multiple hypoechoic lesions throughout the liver. History of hemochromatosis. FIB4: 0.9. History of elevated LFTs. Patient is followed by GI/hepatology. Reviewed relevant medications and labs. Patient is following our Weight Management Clinic. See Obesity tab for medication changes.

## 2023-12-23 NOTE — Assessment & Plan Note (Signed)
 Fibroscan on 03/04/23 showed F2. MRI on 06/14/22 showed normal liver size with normal liver contours. US  04/30/22 showed the liver was mildly enlarged and heterogenous in echotexture. Multiple hypoechoic lesions throughout the liver. History of hemochromatosis. FIB4: 0.9. History of elevated LFTs. Patient is followed by GI/hepatology. Reviewed relevant medications and labs. Patient is following our Weight Management Clinic. See Obesity tab for medication changes.     Lab Results   Component Value Date    AST 15 07/31/2023    AST 13 03/04/2023    AST 21 12/04/2022    ALT 14 07/31/2023    ALT 20 03/04/2023    ALT 42 12/04/2022    PLT 301 03/04/2023    PLT 280 04/24/2022    PLT 268 08/01/2021     MASLD/NAFLD Screening: (12/23/23)  Points <1.45: Cirrhosis less likely  FIB-4 Calculation: 0.39 at 03/04/2023 10:57 AM  Calculated from:  SGOT/AST: 13 U/L at 03/04/2023 10:56 AM  SGPT/ALT: 20 U/L at 03/04/2023 10:56 AM  Platelets: 301 10*9/L at 03/04/2023 10:57 AM  Age: 43 years
